# Patient Record
Sex: Female | Born: 1955 | Race: White | Hispanic: No | Smoking: Former smoker
Health system: Southern US, Community
[De-identification: ages and names within clinical notes are randomized; demographics above are authoritative.]

## PROBLEM LIST (undated history)

## (undated) DIAGNOSIS — I2510 Atherosclerotic heart disease of native coronary artery without angina pectoris: Secondary | ICD-10-CM

## (undated) DIAGNOSIS — R928 Other abnormal and inconclusive findings on diagnostic imaging of breast: Secondary | ICD-10-CM

## (undated) DIAGNOSIS — R7611 Nonspecific reaction to tuberculin skin test without active tuberculosis: Secondary | ICD-10-CM

## (undated) DIAGNOSIS — R42 Dizziness and giddiness: Secondary | ICD-10-CM

## (undated) HISTORY — DX: Atherosclerotic heart disease of native coronary artery without angina pectoris: I25.10

## (undated) HISTORY — PX: CHOLECYSTECTOMY: SHX55

## (undated) HISTORY — DX: Dizziness and giddiness: R42

## (undated) HISTORY — DX: Nonspecific reaction to tuberculin skin test without active tuberculosis: R76.11

## (undated) HISTORY — DX: Other abnormal and inconclusive findings on diagnostic imaging of breast: R92.8

## (undated) HISTORY — PX: BREAST BIOPSY: SHX20

## (undated) HISTORY — PX: TUBAL LIGATION: SHX77

---

## 2014-03-01 ENCOUNTER — Emergency Department (HOSPITAL_COMMUNITY): Payer: 59

## 2014-03-01 ENCOUNTER — Encounter (HOSPITAL_COMMUNITY): Payer: Self-pay

## 2014-03-01 ENCOUNTER — Emergency Department (HOSPITAL_COMMUNITY)
Admission: EM | Admit: 2014-03-01 | Discharge: 2014-03-01 | Disposition: A | Discharge status: Home / Self Care | Payer: 59 | Attending: Emergency Medicine | Admitting: Emergency Medicine

## 2014-03-01 DIAGNOSIS — R61 Generalized hyperhidrosis: Secondary | ICD-10-CM | POA: Insufficient documentation

## 2014-03-01 DIAGNOSIS — R000 Tachycardia, unspecified: Secondary | ICD-10-CM | POA: Insufficient documentation

## 2014-03-01 DIAGNOSIS — R0602 Shortness of breath: Secondary | ICD-10-CM | POA: Insufficient documentation

## 2014-03-01 DIAGNOSIS — R079 Chest pain, unspecified: Secondary | ICD-10-CM | POA: Diagnosis present

## 2014-03-01 DIAGNOSIS — R110 Nausea: Secondary | ICD-10-CM | POA: Diagnosis not present

## 2014-03-01 DIAGNOSIS — R0789 Other chest pain: Secondary | ICD-10-CM | POA: Diagnosis not present

## 2014-03-01 LAB — D-DIMER, QUANTITATIVE: D-Dimer, Quant: 0.29 ug/mL-FEU (ref 0.00–0.48)

## 2014-03-01 LAB — I-STAT TROPONIN, ED
TROPONIN I, POC: 0 ng/mL (ref 0.00–0.08)
Troponin i, poc: 0 ng/mL (ref 0.00–0.08)

## 2014-03-01 LAB — BASIC METABOLIC PANEL
ANION GAP: 10 (ref 5–15)
BUN: 13 mg/dL (ref 6–23)
CALCIUM: 9.4 mg/dL (ref 8.4–10.5)
CHLORIDE: 102 meq/L (ref 96–112)
CO2: 27 mmol/L (ref 19–32)
CREATININE: 0.7 mg/dL (ref 0.50–1.10)
GFR calc Af Amer: >90 mL/min (ref >90)
Glucose, Bld: 105 mg/dL — ABNORMAL HIGH (ref 70–99)
POTASSIUM: 4.3 mmol/L (ref 3.5–5.1)
SODIUM: 139 mmol/L (ref 135–145)

## 2014-03-01 LAB — CBC
HCT: 37.4 % (ref 36.0–46.0)
HEMOGLOBIN: 12.2 g/dL (ref 12.0–15.0)
MCH: 31.6 pg (ref 26.0–34.0)
MCHC: 32.6 g/dL (ref 30.0–36.0)
MCV: 96.9 fL (ref 78.0–100.0)
PLATELETS: 322 10*3/uL (ref 150–400)
RBC: 3.86 MIL/uL — ABNORMAL LOW (ref 3.87–5.11)
RDW: 13.1 % (ref 11.5–15.5)
WBC: 8.1 10*3/uL (ref 4.0–10.5)

## 2014-03-01 LAB — BRAIN NATRIURETIC PEPTIDE: B Natriuretic Peptide: 21.8 pg/mL (ref 0.0–100.0)

## 2014-03-01 NOTE — ED Provider Notes (Signed)
CSN: [id number]     Arrival date & time 03/01/14  1110 History   First MD Initiated Contact with Patient 03/01/14 1114     Chief Complaint  Patient presents with  . Chest Pain     (Consider location/radiation/quality/duration/timing/severity/associated sxs/prior Treatment) HPI Comments: Melissa Bautista is a 59 y.o. female with no significant PMHx or FHx, who presents to the ED via EMS with complaints of sudden onset substernal sharp chest pain that began at rest approximately one hour prior to arrival, and was associated with shortness of breath, nausea, and "flushing". Patient was given aspirin and sublingual nitroglycerin with relief of symptoms. Initially her chest pain was 8/10, and she describes it as sharp nonradiating intermittent pain, which has now improved to a 2/10, worse with inspiration. Patient has no family history of DVT/PE/cardiac disease, and no personal history of DVT/PE. Patient denies any fevers chills, wheezing, cough, LE swelling, recent travel, estrogen use, immobilization or surgery, palpitations, claudication, orthopnea, PND, IV drug use or cocaine use, abdominal pain, vomiting, diarrhea, constipation, dysuria, hematuria, vaginal bleeding or discharge, numbness, tingling, or weakness. She has had similar chest pain in the past, recalling one episode that occurred when she was sleeping, and resolved on its own, and she has never had any cardiac workup. She believes that day it had been due to heartburn. She did not try any heartburn medications prior to arrival today, and she had not eaten any meals today. Her only medications are black cohosh for menopausal symptoms, no other medical conditions. Nonsmoker. She is physically active daily doing aerobic and strength work outs at [employer], which she did at 7:30am with no chest pain during her work out.  Patient is a 59 y.o. female presenting with chest pain. The history is provided by the patient. No language interpreter was used.   Chest Pain Pain location:  Substernal area Pain quality: sharp   Pain radiates to:  Does not radiate Pain radiates to the back: no   Pain severity:  Moderate Onset quality:  Sudden Duration:  1 hour Timing:  Constant Progression:  Partially resolved Chronicity:  New Context: at rest   Relieved by:  Aspirin and nitroglycerin Worsened by:  Deep breathing Ineffective treatments:  None tried Associated symptoms: diaphoresis ("flushed"), nausea and shortness of breath   Associated symptoms: no abdominal pain, no anxiety, no back pain, no claudication, no cough, no dizziness, no fever, no headache, no heartburn, no lower extremity edema, no near-syncope, no numbness, no orthopnea, no palpitations, no PND, no syncope, not vomiting and no weakness   Risk factors: no diabetes mellitus, no hypertension, no prior DVT/PE, no smoking and no surgery     No past medical history on file. No past surgical history on file. No family history on file. History  Substance Use Topics  . Smoking status: Not on file  . Smokeless tobacco: Not on file  . Alcohol Use: Not on file   OB History    No data available     Review of Systems  Constitutional: Positive for diaphoresis ("flushed"). Negative for fever and chills.  Eyes: Negative for visual disturbance.  Respiratory: Positive for shortness of breath. Negative for cough and wheezing.   Cardiovascular: Positive for chest pain. Negative for palpitations, orthopnea, claudication, leg swelling, syncope, PND and near-syncope.  Gastrointestinal: Positive for nausea. Negative for heartburn, vomiting, abdominal pain, diarrhea and constipation.  Genitourinary: Negative for dysuria and hematuria.  Musculoskeletal: Negative for myalgias, back pain, arthralgias and neck pain.  Skin:  Negative for color change.  Allergic/Immunologic: Negative for immunocompromised state.  Neurological: Negative for dizziness, syncope, weakness, light-headedness, numbness and  headaches.   10 Systems reviewed and are negative for acute change except as noted in the HPI.    Allergies  Review of patient's allergies indicates no known allergies.  Home Medications   Prior to Admission medications   Not on File   BP 155/77 mmHg  Pulse 95  Temp(Src) 97.9 F (36.6 C) (Oral)  Resp 16  SpO2 99%  LMP  (Approximate) Physical Exam  Constitutional: She is oriented to person, place, and time. Vital signs are normal. She appears well-developed and well-nourished.  Non-toxic appearance. No distress.  Afebrile, nontoxic, NAD, VSS  HENT:  Head: Normocephalic and atraumatic.  Mouth/Throat: Oropharynx is clear and moist and mucous membranes are normal.  Eyes: Conjunctivae and EOM are normal. Right eye exhibits no discharge. Left eye exhibits no discharge.  Neck: Normal range of motion. Neck supple. No JVD present. Carotid bruit is not present.  No JVD or carotid bruit  Cardiovascular: Normal rate, regular rhythm, normal heart sounds and intact distal pulses.  Exam reveals no gallop and no friction rub.   No murmur heard. Pulses:      Radial pulses are 2+ on the right side, and 2+ on the left side.       Femoral pulses are 2+ on the right side, and 2+ on the left side.      Dorsalis pedis pulses are 1+ on the left side.       Posterior tibial pulses are 2+ on the right side, and 2+ on the left side.  RRR, nl s1/s2, no m/r/g, distal pulses intact although left DP pulses slightly diminished but radial/femoral/PT pulses all 2+ and equal bilaterally  Pulmonary/Chest: Effort normal and breath sounds normal. No respiratory distress. She has no decreased breath sounds. She has no wheezes. She has no rhonchi. She has no rales.  CTAB in all lung fields, no w/r/r, no chest wall TTP  Abdominal: Soft. Normal appearance and bowel sounds are normal. She exhibits no distension. There is no tenderness. There is no rigidity, no rebound and no guarding.  Musculoskeletal: Normal range of  motion.  MAE x4 No pedal edema  Neurological: She is alert and oriented to person, place, and time. She has normal strength. No sensory deficit. Gait normal.  Skin: Skin is warm, dry and intact. No rash noted.  Psychiatric: She has a normal mood and affect.  Nursing note and vitals reviewed.   ED Course  Procedures (including critical care time) Labs Review Labs Reviewed  CBC - Abnormal; Notable for the following:    RBC 3.86 (*)    All other components within normal limits  BASIC METABOLIC PANEL - Abnormal; Notable for the following:    Glucose, Bld 105 (*)    All other components within normal limits  D-DIMER, QUANTITATIVE  BRAIN NATRIURETIC PEPTIDE  I-STAT TROPOININ, ED  Rosezena SensorI-STAT TROPOININ, ED    Imaging Review Dg Chest 2 View  03/01/2014   CLINICAL DATA:  Chest pain for few hr, history of tobacco use  EXAM: CHEST  2 VIEW  COMPARISON:  None.  FINDINGS: The heart size and mediastinal contours are within normal limits. Both lungs are clear. The visualized skeletal structures are unremarkable.  IMPRESSION: No active cardiopulmonary disease.   Electronically Signed   By: Alcide CleverMark  Lukens M.D.   On: 03/01/2014 13:14     EKG Interpretation   Date/Time:  Thursday March 01 2014 11:18:09 EST Ventricular Rate:  93 PR Interval:  139 QRS Duration: 94 QT Interval:  354 QTC Calculation: 440 R Axis:   44 Text Interpretation:  Sinus rhythm Probable left atrial enlargement No old  tracing to compare Confirmed by [facility]  MD, Nicholos Johns [id number]) on  03/01/2014 11:33:13 AM      MDM   Final diagnoses:  Chest pain  Atypical chest pain    59 y.o. female with sudden onset CP that resolved with ASA/NTG given by EMS. Initial VS showed pt was slightly tachycardic, hypertensive at 180/90, but upon arrival her VS are WNL. No personal or family hx of DVT/PE/cardiac conditions. EKG with NSR, unremarkable. Will obtain labs and reassess. Pt stable at this time.   1:58 PM No ongoing symptoms, she  remains symptom free without further intervention after arrival. Trop neg, EKG with NSR. CBC and BMP unremarkable. D-dimer WNL. BNP WNL. CXR unremarkable. Will repeat Trop at 1448 (3hrs after initial trop), if neg I believe pt would be safe for discharge since pt doesn't want to be admitted, and HEART score is 2 (for age and moderately suspicious history). Additionally, she is very physically active and was active prior to her onset of symptoms during which time she had no cardiac or pulm symptoms/difficulty. Will reassess shortly.  3:15 PM Second troponin neg. Pt without ongoing symptoms. At this time, given stable vitals and no persistent symptoms, and negative cardiac work up, will have her f/up as outpt with cardiology. Patient is to be discharged with recommendation to follow up with PCP in regards to today's hospital visit. Pts symptoms unlikely to be of CAD etiology and pt has not reported any CP while in my care. Labs and imaging reviewed again prior to dc. CXR and ECG with no acute abnormalities, and neg troponins x2. Pt has been advised return to the ED if develop any exertional CP- strict return precautions discussed & patient's questions answered. Pt appears reliable for follow up and is agreeable to discharge.    BP 125/74 mmHg  Pulse 78  Temp(Src) 97.9 F (36.6 C) (Oral)  Resp 13  SpO2 98%  LMP  (Approximate)    Donnita Falls Camprubi-Soms, PA-C 03/01/14 1519  Samuel Jester, DO 03/04/14 1324

## 2014-03-01 NOTE — ED Notes (Signed)
1000: sharp sub-sternal chest pain, flushed color, non radiating, some nausea. Hx. Of vertigo. No vertigo symptoms. Took 324 mg of asa, bp 180/90, st 106, 12 lead unremarkable; 20 lt. A/c. 0.4 mg ntg. Sl with relief 2/10, bp 140/80, hr. 90's. Improved sob.

## 2014-03-01 NOTE — Discharge Instructions (Signed)
Your chest pain has been evaluated and no cardiac source was found today. There are many reasons for chest pain. You will need to follow up with cardiologist for further work up. Return to the ER for changes or worsening symptoms   Chest Pain (Nonspecific) It is often hard to give a diagnosis for the cause of chest pain. There is always a chance that your pain could be related to something serious, such as a heart attack or a blood clot in the lungs. You need to follow up with your doctor. HOME CARE  If antibiotic medicine was given, take it as directed by your doctor. Finish the medicine even if you start to feel better.  For the next few days, avoid activities that bring on chest pain. Continue physical activities as told by your doctor.  Do not use any tobacco products. This includes cigarettes, chewing tobacco, and e-cigarettes.  Avoid drinking alcohol.  Only take medicine as told by your doctor.  Follow your doctor's suggestions for more testing if your chest pain does not go away.  Keep all doctor visits you made. GET HELP IF:  Your chest pain does not go away, even after treatment.  You have a rash with blisters on your chest.  You have a fever. GET HELP RIGHT AWAY IF:   You have more pain or pain that spreads to your arm, neck, jaw, back, or belly (abdomen).  You have shortness of breath.  You cough more than usual or cough up blood.  You have very bad back or belly pain.  You feel sick to your stomach (nauseous) or throw up (vomit).  You have very bad weakness.  You pass out (faint).  You have chills. This is an emergency. Do not wait to see if the problems will go away. Call your local emergency services (911 in [place] Do not drive yourself to the hospital. MAKE SURE YOU:   Understand these instructions.  Will watch your condition.  Will get help right away if you are not doing well or get worse. Document Released: 07/15/2007 Document Revised: 01/31/2013  Document Reviewed: 07/15/2007 [facility] Patient Information 2015 [place]. This information is not intended to replace advice given to you by your health care provider. Make sure you discuss any questions you have with your health care provider.

## 2014-03-14 ENCOUNTER — Ambulatory Visit (INDEPENDENT_AMBULATORY_CARE_PROVIDER_SITE_OTHER): Payer: 59 | Admitting: Family Medicine

## 2014-03-14 ENCOUNTER — Encounter: Payer: Self-pay | Admitting: Family Medicine

## 2014-03-14 VITALS — BP 138/76 | HR 94 | Temp 97.6°F | Ht 64.75 in | Wt 170.7 lb

## 2014-03-14 DIAGNOSIS — H8110 Benign paroxysmal vertigo, unspecified ear: Secondary | ICD-10-CM

## 2014-03-14 DIAGNOSIS — R0789 Other chest pain: Secondary | ICD-10-CM

## 2014-03-14 DIAGNOSIS — Z131 Encounter for screening for diabetes mellitus: Secondary | ICD-10-CM

## 2014-03-14 DIAGNOSIS — E785 Hyperlipidemia, unspecified: Secondary | ICD-10-CM

## 2014-03-14 LAB — LIPID PANEL
CHOL/HDL RATIO: 2
Cholesterol: 201 mg/dL — ABNORMAL HIGH (ref 0–200)
HDL: 94.8 mg/dL (ref >39.00)
LDL Cholesterol: 89 mg/dL (ref 0–99)
NonHDL: 106.2
Triglycerides: 84 mg/dL (ref 0.0–149.0)
VLDL: 16.8 mg/dL (ref 0.0–40.0)

## 2014-03-14 LAB — HEMOGLOBIN A1C: Hgb A1c MFr Bld: 5.9 % (ref 4.6–6.5)

## 2014-03-14 MED ORDER — MECLIZINE HCL 25 MG PO TABS: 25.0000 mg | ORAL_TABLET | ORAL | Status: DC | PRN

## 2014-03-14 NOTE — Progress Notes (Signed)
HPI:  Melissa Bautista is here to establish care. Recently moved here from [place].  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  ROS negative for unless reported above: fevers, unintentional weight loss, hearing or vision loss, chest pain, palpitations, struggling to breath, hemoptysis, melena, hematochezia, hematuria, falls, loc, si, thoughts of self harm  Past Medical History  Diagnosis Date  . Positive TB test     ? she report slighly less the +, neg cxr  . Vertigo   . Abnormal mammogram     Past Surgical History  Procedure Laterality Date  . Breast biopsy      x3-benign per patient  . Cholecystectomy    . Tubal ligation      Family History  Problem Relation Age of Onset  . Uterine cancer Maternal Grandmother   . Breast cancer Sister   . Breast cancer Mother   . Breast cancer Cousin     History   Social History  . Marital Status: Single    Spouse Name: N/A    Number of Children: N/A  . Years of Education: N/A   Social History Main Topics  . Smoking status: Former Smoker    Quit date: 03/01/2013  . Smokeless tobacco: None  . Alcohol Use: Yes     Comment: 3 glasses of wine/week  . Drug Use: No  . Sexual Activity: None   Other Topics Concern  . None   Social History Narrative     Current outpatient prescriptions:  .  BLACK COHOSH PO, Take 1 tablet by mouth daily., Disp: , Rfl:  .   cholecalciferol (VITAMIN D) 1000 UNITS tablet, Take 1,000 Units by mouth daily., Disp: , Rfl:  .  meclizine (ANTIVERT) 25 MG tablet, Take 1 tablet (25 mg total) by mouth as needed for dizziness., Disp: 30 tablet, Rfl: 1 .  Multiple Vitamins-Minerals (MULTIVITAMIN WITH MINERALS) tablet, Take 1 tablet by mouth daily., Disp: , Rfl:  .  vitamin B-12 (CYANOCOBALAMIN) 100 MCG tablet, Take 100 mcg by mouth daily., Disp: , Rfl:   EXAM:  Filed Vitals:   03/14/14 1126  BP: 138/76  Pulse: 94  Temp: 97.6 F (36.4 C)    Body mass index is 28.61 kg/(m^2).  GENERAL: vitals reviewed and listed above, alert, oriented, appears well hydrated and in no acute distress  HEENT: atraumatic, conjunttiva clear, no obvious abnormalities on inspection of external nose and ears  NECK: no obvious masses on inspection  LUNGS: clear to auscultation bilaterally, no wheezes, rales or rhonchi, good air movement  CV: HRRR, no peripheral edema  MS: moves all extremities without noticeable abnormality  PSYCH: pleasant and cooperative, no obvious depression or anxiety  ASSESSMENT AND PLAN:  Discussed the following assessment and plan:  Hyperlipemia - Plan: Lipid Panel  BPPV (benign paroxysmal positional vertigo), unspecified laterality  Atypical chest pain - Plan: Exercise tolerance test  Screening for diabetes mellitus - Plan: Hemoglobin A1c -We reviewed the PMH, PSH, FH, SH, Meds and Allergies. -We provided refills for any medications we will prescribe as needed. -We addressed current concerns per orders and patient instructions. -We have asked for records for pertinent exams, studies, vaccines and notes from previous providers. -We have advised patient to follow up per instructions below.   -Patient advised to return or notify a doctor immediately if symptoms worsen or persist or new concerns arise.  Patient Instructions  BEFORE YOU LEAVE: -labs -schedule physical with pap in 3-4 months  Schedule  mammogram  Check on the Tdap vaccine  Sent referral for the exercise stress test  -We have ordered labs or studies at this visit. It can take up to 1-2 weeks for results and processing. We will contact you with instructions IF your results are abnormal. Normal results will be released to your [facility]. If you have not heard from us or can not find your results in [facility] in 2 weeks please contact our office.  -PLEASE SIGN UP FOR MYCHART TODAY   We recommend the following healthy lifestyle measures: - eat a healthy diet consisting of lots of vegetables, fruits, beans, nuts, seeds, healthy meats such as white chicken and fish and whole grains.  - avoid fried foods, fast food, processed foods, sodas, red meet and other fattening foods.  - get a least 150 minutes of aerobic exercise per week.        Kriste BasqueKIM, Woodard Perrell R.

## 2014-03-14 NOTE — Progress Notes (Signed)
Pre visit review using our clinic review tool, if applicable. No additional management support is needed unless otherwise documented below in the visit note. 

## 2014-03-14 NOTE — Patient Instructions (Signed)
BEFORE YOU LEAVE: -labs -schedule physical with pap in 3-4 months  Schedule mammogram  Check on the Tdap vaccine  Sent referral for the exercise stress test  -We have ordered labs or studies at this visit. It can take up to 1-2 weeks for results and processing. We will contact you with instructions IF your results are abnormal. Normal results will be released to your [facility]. If you have not heard from us or can not find your results in [facility] in 2 weeks please contact our office.  -PLEASE SIGN UP FOR MYCHART TODAY   We recommend the following healthy lifestyle measures: - eat a healthy diet consisting of lots of vegetables, fruits, beans, nuts, seeds, healthy meats such as white chicken and fish and whole grains.  - avoid fried foods, fast food, processed foods, sodas, red meet and other fattening foods.  - get a least 150 minutes of aerobic exercise per week.

## 2014-04-04 ENCOUNTER — Encounter: Payer: 59 | Admitting: Nurse Practitioner

## 2014-05-01 ENCOUNTER — Other Ambulatory Visit: Payer: Self-pay

## 2014-05-01 DIAGNOSIS — Z1231 Encounter for screening mammogram for malignant neoplasm of breast: Secondary | ICD-10-CM

## 2014-05-11 ENCOUNTER — Encounter: Payer: 59 | Admitting: Nurse Practitioner

## 2014-05-28 ENCOUNTER — Ambulatory Visit
Admission: RE | Admit: 2014-05-28 | Discharge: 2014-05-28 | Disposition: A | Discharge status: Home / Self Care | Payer: 59 | Source: Ambulatory Visit

## 2014-05-28 DIAGNOSIS — Z1231 Encounter for screening mammogram for malignant neoplasm of breast: Secondary | ICD-10-CM

## 2014-06-19 ENCOUNTER — Encounter: Payer: Self-pay | Admitting: Family Medicine

## 2014-06-19 ENCOUNTER — Ambulatory Visit (INDEPENDENT_AMBULATORY_CARE_PROVIDER_SITE_OTHER): Payer: 59 | Admitting: Family Medicine

## 2014-06-19 VITALS — BP 144/90 | HR 94 | Temp 98.0°F | Ht 64.5 in | Wt 170.8 lb

## 2014-06-19 DIAGNOSIS — R030 Elevated blood-pressure reading, without diagnosis of hypertension: Secondary | ICD-10-CM

## 2014-06-19 DIAGNOSIS — IMO0001 Reserved for inherently not codable concepts without codable children: Secondary | ICD-10-CM

## 2014-06-19 DIAGNOSIS — Z23 Encounter for immunization: Secondary | ICD-10-CM

## 2014-06-19 DIAGNOSIS — Z0000 Encounter for general adult medical examination without abnormal findings: Secondary | ICD-10-CM

## 2014-06-19 DIAGNOSIS — Z803 Family history of malignant neoplasm of breast: Secondary | ICD-10-CM

## 2014-06-19 NOTE — Addendum Note (Signed)
Addended by: Sallee LangeFUNDERBURK, JO A on: 06/19/2014 05:00 PM   Modules accepted: Orders

## 2014-06-19 NOTE — Progress Notes (Addendum)
HPI:  Here for CPE:  -Concerns and/or follow up today: none  -Diet: variety of foods, balance and well rounded  -Exercise: regular exercise - 4 days per week  -Taking folic acid, vitamin D or calcium: yes  -Diabetes and Dyslipidemia Screening: labs done, mild hyperlipidemia in the past  -Hx of HTN: no  -Vaccines: Tdap today  -pap history: done 2014 per her report and normal, reports all have been normal, she reports these have all been normal  -FDLMP: postmenopausal  -sexual activity: yes, female partner, no new partners  -wants STI testing: no  -FH breast, colon or ovarian ca: see FH Last mammogram: done Last colon cancer screening: done in 2009, normal per her report and told to repeat in 10 years  Breast Ca Risk Assessment: -sister doing genetic testing due to FH, she may consider but is too expensive and does not want to do at this time - she reports she will call me if decides to do this  -Alcohol, Tobacco, drug use: see social history  Review of Systems - no fevers, unintentional weight loss, vision loss, hearing loss, chest pain, sob, hemoptysis, melena, hematochezia, hematuria, genital discharge, changing or concerning skin lesions, bleeding, bruising, loc, thoughts of self harm or SI  Past Medical History  Diagnosis Date  . Positive TB test     ? she report slighly less the +, neg cxr  . Vertigo   . Abnormal mammogram     Past Surgical History  Procedure Laterality Date  . Breast biopsy      x3-benign per patient  . Cholecystectomy    . Tubal ligation      Family History  Problem Relation Age of Onset  . Uterine cancer Maternal Grandmother   . Breast cancer Sister   . Breast cancer Mother   . Breast cancer Cousin     History   Social History  . Marital Status: Divorced    Spouse Name: N/A  . Number of Children: N/A  . Years of Education: N/A   Social History Main Topics  . Smoking status: Former Smoker    Quit date: 03/01/2013  .  Smokeless tobacco: Not on file  . Alcohol Use: Yes     Comment: 3 glasses of wine/week  . Drug Use: No  . Sexual Activity: Not on file   Other Topics Concern  . 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  BLACK COHOSH PO, Take 1 tablet by mouth daily., Disp: , Rfl:  .  cholecalciferol (VITAMIN D) 1000 UNITS tablet, Take 1,000 Units by mouth daily., Disp: , Rfl:  .  meclizine (ANTIVERT) 25 MG tablet, Take 1 tablet (25 mg total) by mouth as needed for dizziness., Disp: 30 tablet, Rfl: 1 .  Multiple Vitamins-Minerals (MULTIVITAMIN WITH MINERALS) tablet, Take 1 tablet by mouth daily., Disp: , Rfl:  .  vitamin B-12 (CYANOCOBALAMIN) 100 MCG tablet, Take 100 mcg by mouth daily., Disp: , Rfl:   EXAM:  Filed Vitals:   06/19/14 1658  BP: 144/90  Pulse:   Temp:     GENERAL: vitals reviewed and listed below, alert, oriented, appears well hydrated and in no acute distress  HEENT: head atraumatic, PERRLA, normal  appearance of eyes, ears, nose and mouth. moist mucus membranes.  NECK: supple, no masses or lymphadenopathy  LUNGS: clear to auscultation bilaterally, no rales, rhonchi or wheeze  CV: HRRR, no peripheral edema or cyanosis, normal pedal pulses  BREAST: normal appearance - no lesions or discharge, on palpation normal breast tissue without any suspicious masses  ABDOMEN: bowel sounds normal, soft, non tender to palpation, no masses, no rebound or guarding  GU: offered pelvic, declined  RECTAL: refused  SKIN: no rash or abnormal lesions  MS: normal gait, moves all extremities normally  NEURO: CN II-XII grossly intact, normal muscle strength and sensation to light touch on extremities  PSYCH: normal affect, pleasant and cooperative  ASSESSMENT AND PLAN:  Discussed the  following assessment and plan:  Visit for preventive health examination  FH: breast cancer  Need for Tdap vaccination - Plan: Tdap vaccine greater than or equal to 7yo IM  Elevated blood pressure  -Discussed and advised all [place] preventive services health task force level A and B recommendations for age, sex and risks. -Advised at least 150 minutes of exercise per week and a healthy diet low in saturated fats and sweets and consisting of fresh fruits and vegetables, lean meats such as fish and white chicken and whole grains. -labs already done, studies and vaccines per orders this encounter -we spent a long time discussing her FH of breast cancer and I advised genetic testing and thought a yearly pelvic exam with a gynecologist may be a good idea also - she may consider this, but declined today -tdap  Orders Placed This Encounter  Procedures  . Tdap vaccine greater than or equal to 7yo IM    Patient advised to return to clinic immediately if symptoms worsen or persist or new concerns.  Patient Instructions  Before you leave: -Tdap -schedule follow up in 2-4 weeks to recheck BP  Follow up yearly, will need pap at next physical.  We recommend the following healthy lifestyle measures: - eat a healthy diet consisting of lots of vegetables, fruits, beans, nuts, seeds, healthy meats such as white chicken and fish and whole grains.  - avoid fried foods, fast food, processed foods, sodas, red meet and other fattening foods.  - get a least 150 minutes of aerobic exercise per week.      Return in about 1 year (around 06/19/2015), or if symptoms worsen or fail to improve.  Kriste BasqueKIM, Sonam Wandel R.

## 2014-06-19 NOTE — Progress Notes (Signed)
Pre visit review using our clinic review tool, if applicable. No additional management support is needed unless otherwise documented below in the visit note. 

## 2014-06-19 NOTE — Patient Instructions (Addendum)
Before you leave: -Tdap -schedule follow up in 2-4 weeks to recheck BP  Follow up yearly, will need pap at next physical.  We recommend the following healthy lifestyle measures: - eat a healthy diet consisting of lots of vegetables, fruits, beans, nuts, seeds, healthy meats such as white chicken and fish and whole grains.  - avoid fried foods, fast food, processed foods, sodas, red meet and other fattening foods.  - get a least 150 minutes of aerobic exercise per week.

## 2014-06-29 ENCOUNTER — Encounter: Payer: Self-pay | Admitting: *Deleted

## 2014-07-10 ENCOUNTER — Ambulatory Visit (INDEPENDENT_AMBULATORY_CARE_PROVIDER_SITE_OTHER): Payer: 59 | Admitting: Family Medicine

## 2014-07-10 ENCOUNTER — Encounter: Payer: Self-pay | Admitting: Family Medicine

## 2014-07-10 VITALS — BP 128/80 | HR 90 | Temp 97.7°F | Ht 64.5 in | Wt 170.4 lb

## 2014-07-10 DIAGNOSIS — I10 Essential (primary) hypertension: Secondary | ICD-10-CM | POA: Diagnosis not present

## 2014-07-10 NOTE — Progress Notes (Signed)
Pre visit review using our clinic review tool, if applicable. No additional management support is needed unless otherwise documented below in the visit note. 

## 2014-07-10 NOTE — Patient Instructions (Signed)
-  please check blood pressure from time to time at home and keep a log (once per week)  -call if running high (over 140/90)  -follow up in 3-4 months

## 2014-07-10 NOTE — Progress Notes (Signed)
  HPI:  Follow up HTN: -elevated last visit -reports: BP at home is 120-130/70/80 -denies: CP, DOE, HA, visions changes  ROS: See pertinent positives and negatives per HPI.  Past Medical History  Diagnosis Date  . Positive TB test     ? she report slighly less the +, neg cxr  . Vertigo   . Abnormal mammogram     Past Surgical History  Procedure Laterality Date  . Breast biopsy      x3-benign per patient  . Cholecystectomy    . Tubal ligation      Family History  Problem Relation Age of Onset  . Uterine cancer Maternal Grandmother   . Breast cancer Sister   . Breast cancer Mother   . Breast cancer Cousin     History   Social History  . Marital Status: Divorced    Spouse Name: N/A  . Number of Children: N/A  . Years of Education: N/A   Social History Main Topics  . Smoking status: Former Smoker    Quit date: 03/01/2013  . Smokeless tobacco: Not on file  . Alcohol Use: Yes     Comment: 3 glasses of wine/week  . Drug Use: No  . Sexual Activity: Not on file   Other Topics Concern  . 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  BLACK COHOSH PO, Take 1 tablet by mouth daily., Disp: , Rfl:  .  cholecalciferol (VITAMIN D) 1000 UNITS tablet, Take 1,000 Units by mouth daily., Disp: , Rfl:  .  meclizine (ANTIVERT) 25 MG tablet, Take 1 tablet (25 mg total) by mouth as needed for dizziness., Disp: 30 tablet, Rfl: 1 .  Multiple Vitamins-Minerals (MULTIVITAMIN WITH MINERALS) tablet, Take 1 tablet by mouth daily., Disp: , Rfl:  .  vitamin B-12 (CYANOCOBALAMIN) 100 MCG tablet, Take 100 mcg by mouth daily., Disp: , Rfl:   EXAM:  Filed Vitals:   07/10/14 0902  BP: 128/80  Pulse:   Temp:     Body mass index is 28.81 kg/(m^2).  GENERAL: vitals reviewed and listed above,  alert, oriented, appears well hydrated and in no acute distress  HEENT: atraumatic, conjunttiva clear, no obvious abnormalities on inspection of external nose and ears  NECK: no obvious masses on inspection  LUNGS: clear to auscultation bilaterally, no wheezes, rales or rhonchi, good air movement  CV: HRRR, no peripheral edema  MS: moves all extremities without noticeable abnormality  PSYCH: pleasant and cooperative, no obvious depression or anxiety  ASSESSMENT AND PLAN:  Discussed the following assessment and plan:  Essential hypertension  -BP at home great and great on recheck -monitor -Patient advised to return or notify a doctor immediately if symptoms worsen or persist or new concerns arise.  Patient Instructions  -please check blood pressure from time to time at home and keep a log (once per week)  -call if running high (over 140/90)  -follow up in 3-4 months     KIM, HANNAH R.

## 2014-11-06 ENCOUNTER — Encounter: Payer: Self-pay | Admitting: Family Medicine

## 2014-11-06 ENCOUNTER — Ambulatory Visit (INDEPENDENT_AMBULATORY_CARE_PROVIDER_SITE_OTHER): Payer: 59 | Admitting: Family Medicine

## 2014-11-06 VITALS — BP 128/86 | HR 93 | Temp 98.0°F | Ht 64.5 in | Wt 178.5 lb

## 2014-11-06 DIAGNOSIS — E669 Obesity, unspecified: Secondary | ICD-10-CM

## 2014-11-06 DIAGNOSIS — R739 Hyperglycemia, unspecified: Secondary | ICD-10-CM

## 2014-11-06 DIAGNOSIS — R030 Elevated blood-pressure reading, without diagnosis of hypertension: Secondary | ICD-10-CM

## 2014-11-06 DIAGNOSIS — IMO0001 Reserved for inherently not codable concepts without codable children: Secondary | ICD-10-CM

## 2014-11-06 NOTE — Progress Notes (Signed)
HPI:   Follow up HTN/Obesity/Prediabetes: -elevated last visit; normal on recheck and opted for observation and home monitoring -reports: BP at home is 120-130/70/80 -just started exercising 3 weeks ago -reports diet is ok - not enough fiber and daily refined sugar and white starches -denies: CP, DOE, HA, visions changes  ROS: See pertinent positives and negatives per HPI.  Past Medical History  Diagnosis Date  . Positive TB test     ? she report slighly less the +, neg cxr  . Vertigo   . Abnormal mammogram     Past Surgical History  Procedure Laterality Date  . Breast biopsy      x3-benign per patient  . Cholecystectomy    . Tubal ligation      Family History  Problem Relation Age of Onset  . Uterine cancer Maternal Grandmother   . Breast cancer Sister   . Breast cancer Mother   . Breast cancer Cousin     Social History   Social History  . Marital Status: Divorced    Spouse Name: N/A  . Number of Children: N/A  . Years of Education: N/A   Social History Main Topics  . Smoking status: Former Smoker    Quit date: 03/01/2013  . Smokeless tobacco: None  . Alcohol Use: Yes     Comment: 3 glasses of wine/week  . Drug Use: No  . Sexual Activity: Not Asked   Other Topics Concern  . 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  BLACK COHOSH PO, Take 1 tablet by mouth daily., Disp: , Rfl:  .  cholecalciferol (VITAMIN D) 1000 UNITS tablet, Take 1,000 Units by mouth daily., Disp: , Rfl:  .  CINNAMON PO, Take by mouth., Disp: , Rfl:  .  meclizine (ANTIVERT) 25 MG tablet, Take 1 tablet (25 mg total) by mouth as needed for dizziness., Disp: 30 tablet, Rfl: 1 .  Multiple Vitamins-Minerals (MULTIVITAMIN WITH MINERALS) tablet, Take 1 tablet by mouth daily., Disp: , Rfl:  .   Red Yeast Rice Extract (RED YEAST RICE PO), Take by mouth., Disp: , Rfl:  .  vitamin B-12 (CYANOCOBALAMIN) 100 MCG tablet, Take 100 mcg by mouth daily., Disp: , Rfl:   EXAM:  Filed Vitals:   11/06/14 1608  BP: 128/86  Pulse: 93  Temp: 98 F (36.7 C)    Body mass index is 30.18 kg/(m^2).  GENERAL: vitals reviewed and listed above, alert, oriented, appears well hydrated and in no acute distress  HEENT: atraumatic, conjunttiva clear, no obvious abnormalities on inspection of external nose and ears  NECK: no obvious masses on inspection  LUNGS: clear to auscultation bilaterally, no wheezes, rales or rhonchi, good air movement  CV: HRRR, no peripheral edema  MS: moves all extremities without noticeable abnormality  PSYCH: pleasant and cooperative, no obvious depression or anxiety  ASSESSMENT AND PLAN:  Discussed the following assessment and plan:  Obesity - Plan: TSH  Hyperglycemia - Plan: Hemoglobin A1c  Elevated blood pressure  -BP ok -she is frustrated about weight and wants to check thyroid level -discussed lifestyle changes at length - regular lifelong exercise, high fiber/low glycemic diet, healthy fats, regular meals -she may consider weight watchers -Patient  advised to return or notify a doctor immediately if symptoms worsen or persist or new concerns arise.  There are no Patient Instructions on file for this visit.   Colin Benton R.

## 2014-11-06 NOTE — Progress Notes (Signed)
Pre visit review using our clinic review tool, if applicable. No additional management support is needed unless otherwise documented below in the visit note. 

## 2014-11-06 NOTE — Patient Instructions (Signed)
BEFORE YOU LEAVE: -labs -follow up in 6 months  We recommend the following healthy lifestyle measures: - eat a healthy diet consisting of small portions of vegetables, fruits, beans, nuts, seeds, healthy meats such as white chicken and fish and whole grains.  - avoid sweets, white starches, fried foods, fast food, processed foods, sodas, red meet and other fattening foods.  - get a least 150 minutes of aerobic exercise per week.

## 2014-11-07 LAB — TSH: TSH: 1.9 u[IU]/mL (ref 0.35–4.50)

## 2014-11-07 LAB — HEMOGLOBIN A1C: Hgb A1c MFr Bld: 5.8 % (ref 4.6–6.5)

## 2014-12-14 ENCOUNTER — Other Ambulatory Visit: Payer: Self-pay | Admitting: Family Medicine

## 2014-12-14 DIAGNOSIS — Z789 Other specified health status: Secondary | ICD-10-CM

## 2014-12-18 ENCOUNTER — Other Ambulatory Visit (INDEPENDENT_AMBULATORY_CARE_PROVIDER_SITE_OTHER): Payer: 59

## 2014-12-18 DIAGNOSIS — Z789 Other specified health status: Secondary | ICD-10-CM

## 2014-12-18 LAB — GLUCOSE, POCT (MANUAL RESULT ENTRY): POC Glucose: 107 mg/dl — AB (ref 70–99)

## 2014-12-19 ENCOUNTER — Other Ambulatory Visit: Payer: 59

## 2015-01-08 ENCOUNTER — Encounter: Payer: Self-pay | Admitting: Family Medicine

## 2015-01-08 ENCOUNTER — Ambulatory Visit (INDEPENDENT_AMBULATORY_CARE_PROVIDER_SITE_OTHER): Payer: 59 | Admitting: Family Medicine

## 2015-01-08 VITALS — BP 132/78 | HR 92 | Temp 97.9°F | Ht 64.5 in | Wt 176.4 lb

## 2015-01-08 DIAGNOSIS — K5900 Constipation, unspecified: Secondary | ICD-10-CM | POA: Diagnosis not present

## 2015-01-08 NOTE — Progress Notes (Signed)
Pre visit review using our clinic review tool, if applicable. No additional management support is needed unless otherwise documented below in the visit note. 

## 2015-01-08 NOTE — Patient Instructions (Signed)
-  metameucil or citracel every morning  -mirilax once daily per instructions if you become stopped up  -please let us know in 1 month how you are doing, sooner if worsening or other concerns

## 2015-01-08 NOTE — Progress Notes (Signed)
HPI:  Acute visit for:  Constipation: -reports always gets stopped up when travels -traveled a few weeks ago and got stopped up -has had 3 episodes of brief, mild (a few minutes) LLQ abd pain in the last few weeks -after thanksgiving dinner cleaned out bowels and feels better now -denies: nausea, vomiting, hematochezia, melena, malaise, weight loss, hematuria, dysuria -UTD on colonoscopy -does pelvic with gyn next week  ROS: See pertinent positives and negatives per HPI.  Past Medical History  Diagnosis Date  . Positive TB test     ? she report slighly less the +, neg cxr  . Vertigo   . Abnormal mammogram     Past Surgical History  Procedure Laterality Date  . Breast biopsy      x3-benign per patient  . Cholecystectomy    . Tubal ligation      Family History  Problem Relation Age of Onset  . Uterine cancer Maternal Grandmother   . Breast cancer Sister   . Breast cancer Mother   . Breast cancer Cousin     Social History   Social History  . Marital Status: Divorced    Spouse Name: N/A  . Number of Children: N/A  . Years of Education: N/A   Social History Main Topics  . Smoking status: Former Smoker    Quit date: 03/01/2013  . Smokeless tobacco: None  . Alcohol Use: Yes     Comment: 3 glasses of wine/week  . Drug Use: No  . Sexual Activity: Not Asked   Other Topics Concern  . 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  BLACK COHOSH PO, Take 1 tablet by mouth daily., Disp: , Rfl:  .  cholecalciferol (VITAMIN D) 1000 UNITS tablet, Take 1,000 Units by mouth daily., Disp: , Rfl:  .  CINNAMON PO, Take by mouth., Disp: , Rfl:  .  meclizine (ANTIVERT) 25 MG tablet, Take 1 tablet (25 mg total) by mouth as needed for dizziness., Disp: 30 tablet, Rfl: 1 .  Multiple  Vitamins-Minerals (MULTIVITAMIN WITH MINERALS) tablet, Take 1 tablet by mouth daily., Disp: , Rfl:  .  Red Yeast Rice Extract (RED YEAST RICE PO), Take by mouth., Disp: , Rfl:  .  vitamin B-12 (CYANOCOBALAMIN) 100 MCG tablet, Take 100 mcg by mouth daily., Disp: , Rfl:   EXAM:  Filed Vitals:   01/08/15 1607  BP: 132/78  Pulse: 92  Temp: 97.9 F (36.6 C)    Body mass index is 29.82 kg/(m^2).  GENERAL: vitals reviewed and listed above, alert, oriented, appears well hydrated and in no acute distress  HEENT: atraumatic, conjunttiva clear, no obvious abnormalities on inspection of external nose and ears  NECK: no obvious masses on inspection  ABD: BS+, soft, NTTP  MS: moves all extremities without noticeable abnormality  PSYCH: pleasant and cooperative, no obvious depression or anxiety  ASSESSMENT AND PLAN:  Discussed the following assessment and plan:  Constipation, unspecified constipation type  -we discussed possible serious and likely etiologies of constipation and LLQ pain, workup and treatment, treatment risks and return precautions -after this discussion, Tresa EndoKelly opted for fiber supplement, mirilax if needed and may use during travel -follow up advised if any symptoms persist  and to let us know how she is feeling in1 month -of course, we advised Dane  to return or notify a doctor immediately if symptoms worsen or persist or new concerns arise.  .  -Patient advised to return or notify a doctor immediately if symptoms worsen or persist or new concerns arise.  Patient Instructions  -metameucil or citracel every morning  -mirilax once daily per instructions if you become stopped up  -please let us know in 1 month how you are doing, sooner if worsening or other concerns     KIM, HANNAH R.

## 2015-04-22 ENCOUNTER — Encounter: Payer: Self-pay | Admitting: Family Medicine

## 2015-04-22 ENCOUNTER — Ambulatory Visit (INDEPENDENT_AMBULATORY_CARE_PROVIDER_SITE_OTHER): Payer: BLUE CROSS/BLUE SHIELD | Admitting: Family Medicine

## 2015-04-22 VITALS — BP 132/74 | HR 84 | Temp 97.7°F | Ht 64.5 in | Wt 179.8 lb

## 2015-04-22 DIAGNOSIS — R55 Syncope and collapse: Secondary | ICD-10-CM | POA: Diagnosis not present

## 2015-04-22 LAB — BASIC METABOLIC PANEL
BUN: 17 mg/dL (ref 7–25)
CHLORIDE: 98 mmol/L (ref 98–110)
CO2: 28 mmol/L (ref 20–31)
CREATININE: 0.65 mg/dL (ref 0.50–1.05)
Calcium: 10.2 mg/dL (ref 8.6–10.4)
Glucose, Bld: 86 mg/dL (ref 65–99)
Potassium: 4.1 mmol/L (ref 3.5–5.3)
Sodium: 138 mmol/L (ref 135–146)

## 2015-04-22 NOTE — Progress Notes (Signed)
Pre visit review using our clinic review tool, if applicable. No additional management support is needed unless otherwise documented below in the visit note. 

## 2015-04-22 NOTE — Patient Instructions (Signed)
BEFORE YOU LEAVE: -EKG -labs -schedule follow up in 1-2 months  We placed a referral for you as discussed for the MRI. It usually takes about 1-2 weeks to process and schedule this referral. If you have not heard from us regarding this appointment in 2 weeks please contact our office.  Caution with urination and standing suddenly. Stay hydrated.  Seek care immediatly if recurrently symptoms or new symptoms.

## 2015-04-22 NOTE — Progress Notes (Signed)
HPI:  Melissa Bautista is a pleasant 60 yo with a PMH vertigo, prediabetes and elevated blood pressure here for an acute visit for syncope. She reports 2 episodes of brief syncope during micturition in the middle of the night 3 nights ago. She was sitting on the toilet and had just finish voiding when she suddenly felt lightheaded with mild nausea and fell forward. This was witnessed by her husband and was several seconds to 25 seconds in length. When she went to stand up she had a second episode of refill LOC. She felt nauseous and clammy after the events. She denies any preceding chest pain, shortness of breath, palpitations, or tongue biting, seizure like activity, headache or recent illness. She has a history of chronic intermittent positional vertigo and reports an extensive workup with neuro imaging 5 years ago. She exercises on a regular basis and does not have any cardiac or presyncopal symptoms with exercise. She has had no symptoms since. She has a history of syncope once remotely as well.  ROS: See pertinent positives and negatives per HPI.  Past Medical History  Diagnosis Date  . Positive TB test     ? she report slighly less the +, neg cxr  . Vertigo   . Abnormal mammogram     Past Surgical History  Procedure Laterality Date  . Breast biopsy      x3-benign per patient  . Cholecystectomy    . Tubal ligation      Family History  Problem Relation Age of Onset  . Uterine cancer Maternal Grandmother   . Breast cancer Sister   . Breast cancer Mother   . Breast cancer Cousin     Social History   Social History  . Marital Status: Divorced    Spouse Name: N/A  . Number of Children: N/A  . Years of Education: N/A   Social History Main Topics  . Smoking status: Former Smoker    Quit date: 03/01/2013  . Smokeless tobacco: None  . Alcohol Use: Yes     Comment: 3 glasses of wine/week  . Drug Use: No  . Sexual Activity: Not Asked   Other Topics Concern  . 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  BLACK COHOSH PO, Take 1 tablet by mouth daily., Disp: , Rfl:  .  cholecalciferol (VITAMIN D) 1000 UNITS tablet, Take 1,000 Units by mouth daily., Disp: , Rfl:  .  CINNAMON PO, Take by mouth., Disp: , Rfl:  .  meclizine (ANTIVERT) 25 MG tablet, Take 1 tablet (25 mg total) by mouth as needed for dizziness., Disp: 30 tablet, Rfl: 1 .  Multiple Vitamins-Minerals (MULTIVITAMIN WITH MINERALS) tablet, Take 1 tablet by mouth daily., Disp: , Rfl:  .  Red Yeast Rice Extract (RED YEAST RICE PO), Take by mouth., Disp: , Rfl:  .  vitamin B-12 (CYANOCOBALAMIN) 100 MCG tablet, Take 100 mcg by mouth daily., Disp: , Rfl:   EXAM:  Filed Vitals:   04/22/15 1434  BP: 132/74  Pulse: 84  Temp: 97.7 F (36.5 C)    Body mass index is 30.4 kg/(m^2).  GENERAL: vitals reviewed and listed above, alert, oriented, appears well hydrated and in no acute  distress  HEENT: atraumatic, conjunttiva clear, PERRLA, EOMI,  no obvious abnormalities on inspection of external nose and ears  NECK: no obvious masses on inspection, no bruit  LUNGS: clear to auscultation bilaterally, no wheezes, rales or rhonchi, good air movement  CV: HRRR, no peripheral edema  MS: moves all extremities without noticeable abnormality  PSYCH: pleasant and cooperative, no obvious depression or anxiety, CN II-XII grossly intact, and thought processing grossly intact, finger to nose normal.  ASSESSMENT AND PLAN:  Discussed the following assessment and plan:  Syncope, unspecified syncope type - Plan: CBC with Differential, Basic metabolic panel, TSH, Hemoglobin A1c, MR Brain W Wo Contrast  -we discussed possible serious and likely etiologies, workup and treatment, treatment risks and return  precautions -EKG with NSR -after this discussion, Tresa EndoKelly opted for labs and MRI (she is worried about intracranial issues) possible neruology consult, she declined today; doubt seizure or cardiac origin and micturition related vasovagal syncope most likely -follow up advised 1-2 months -of course, we advised Tresa EndoKelly  to return or notify a doctor immediately if symptoms worsen or persist or new concerns arise.   -Patient advised to return or notify a doctor immediately if symptoms worsen or persist or new concerns arise.  Patient Instructions  BEFORE YOU LEAVE: -EKG -labs -schedule follow up in 1-2 months  We placed a referral for you as discussed for the MRI. It usually takes about 1-2 weeks to process and schedule this referral. If you have not heard from us regarding this appointment in 2 weeks please contact our office.  Caution with urination and standing suddenly. Stay hydrated.  Seek care immediatly if recurrently symptoms or new symptoms.      Kriste BasqueKIM, HANNAH R.

## 2015-04-23 LAB — TSH: TSH: 2.18 mIU/L

## 2015-04-24 LAB — CBC WITH DIFFERENTIAL/PLATELET

## 2015-04-24 LAB — HEMOGLOBIN A1C

## 2015-05-07 ENCOUNTER — Ambulatory Visit: Payer: 59 | Admitting: Family Medicine

## 2015-05-13 ENCOUNTER — Other Ambulatory Visit: Payer: BLUE CROSS/BLUE SHIELD

## 2015-06-11 ENCOUNTER — Telehealth: Payer: Self-pay | Admitting: *Deleted

## 2015-06-11 NOTE — Telephone Encounter (Signed)
-----   Message from Terressa KoyanagiHannah R Kim, DO sent at 05/05/2015  2:29 PM EDT ----- Can you call pt See how she is doing. Ask if she had tests elsewhere. Thanks. ----- Message -----    From: Johnella MoloneyJo A Funderburk, CMA    Sent: 04/25/2015   4:59 PM      To: Terressa KoyanagiHannah R Kim, DO  Dr Kim-Per Danella DeisShay the pt works for [employer] and told her she would have labs done there. Ronnald CollumJo Anne   ----- Message -----    From: Terressa KoyanagiHannah R Kim, DO    Sent: 04/25/2015   7:06 AM      To: Johnella MoloneyJo A Funderburk, CMA  Can you check on why her labs were canceled? CBC and hgba1c are pending and say canceled. Other labs ok. Thanks.

## 2015-06-11 NOTE — Telephone Encounter (Signed)
Left message on machine for patient to return our call 

## 2015-06-13 NOTE — Telephone Encounter (Signed)
I left a message for the pt to return my call. 

## 2015-06-14 NOTE — Telephone Encounter (Signed)
I left a message for the pt to return my call. 

## 2015-06-18 ENCOUNTER — Encounter: Payer: Self-pay | Admitting: *Deleted

## 2015-06-18 NOTE — Telephone Encounter (Signed)
As of today the pt has not returned my call.  Letter mailed to the pt to call the office regarding lab tests.

## 2015-07-12 ENCOUNTER — Telehealth: Payer: Self-pay | Admitting: *Deleted

## 2015-07-12 ENCOUNTER — Other Ambulatory Visit: Payer: Self-pay | Admitting: Family Medicine

## 2015-07-12 DIAGNOSIS — Z1231 Encounter for screening mammogram for malignant neoplasm of breast: Secondary | ICD-10-CM

## 2015-07-12 NOTE — Telephone Encounter (Signed)
Patient called in reference to the tests that were ordered in March and informed me she did have her lab work done but was told the lavender tube was hemolyzed so the test could not be ran and she wanted to let Dr Selena BattenKim know this.  States she will check her schedule and call back for a lab visit.

## 2015-07-16 ENCOUNTER — Ambulatory Visit: Payer: BLUE CROSS/BLUE SHIELD | Admitting: Family Medicine

## 2015-08-02 ENCOUNTER — Ambulatory Visit
Admission: RE | Admit: 2015-08-02 | Discharge: 2015-08-02 | Disposition: A | Discharge status: Home / Self Care | Payer: BLUE CROSS/BLUE SHIELD | Source: Ambulatory Visit | Attending: Family Medicine | Admitting: Family Medicine

## 2015-08-02 DIAGNOSIS — Z1231 Encounter for screening mammogram for malignant neoplasm of breast: Secondary | ICD-10-CM

## 2016-02-20 ENCOUNTER — Telehealth: Payer: Self-pay | Admitting: Family Medicine

## 2016-02-20 NOTE — Telephone Encounter (Signed)
Pt need to have FMLA paperwork filled out within 5 business days.  Pt is scheduled Friday at 3:15

## 2016-02-20 NOTE — Telephone Encounter (Signed)
Continue please make sure this is a 30 minute appointment so that we can complete her paperwork there is possible? Thank you.

## 2016-02-20 NOTE — Telephone Encounter (Signed)
Melissa Bautista scheduled the pt for a 30 min appt.

## 2016-02-21 ENCOUNTER — Ambulatory Visit (INDEPENDENT_AMBULATORY_CARE_PROVIDER_SITE_OTHER): Payer: BLUE CROSS/BLUE SHIELD | Admitting: Family Medicine

## 2016-02-21 ENCOUNTER — Encounter: Payer: Self-pay | Admitting: Family Medicine

## 2016-02-21 VITALS — BP 144/98 | HR 95 | Temp 97.8°F | Ht 64.5 in | Wt 184.7 lb

## 2016-02-21 DIAGNOSIS — R51 Headache: Secondary | ICD-10-CM

## 2016-02-21 DIAGNOSIS — R42 Dizziness and giddiness: Secondary | ICD-10-CM | POA: Diagnosis not present

## 2016-02-21 DIAGNOSIS — H8110 Benign paroxysmal vertigo, unspecified ear: Secondary | ICD-10-CM

## 2016-02-21 DIAGNOSIS — I10 Essential (primary) hypertension: Secondary | ICD-10-CM | POA: Diagnosis not present

## 2016-02-21 DIAGNOSIS — R519 Headache, unspecified: Secondary | ICD-10-CM

## 2016-02-21 HISTORY — DX: Essential (primary) hypertension: I10

## 2016-02-21 HISTORY — DX: Headache, unspecified: R51.9

## 2016-02-21 HISTORY — DX: Benign paroxysmal vertigo, unspecified ear: H81.10

## 2016-02-21 MED ORDER — MECLIZINE HCL 25 MG PO TABS: 25.0000 mg | ORAL_TABLET | ORAL | 1 refills | Status: DC | PRN

## 2016-02-21 MED ORDER — LOSARTAN POTASSIUM 50 MG PO TABS: 50.0000 mg | ORAL_TABLET | Freq: Every day | ORAL | 3 refills | Status: DC

## 2016-02-21 NOTE — Progress Notes (Signed)
Pre visit review using our clinic review tool, if applicable. No additional management support is needed unless otherwise documented below in the visit note. 

## 2016-02-21 NOTE — Progress Notes (Signed)
HPI:  Melissa Bautista is a pleasant 61 yo F with a PMH significant for prediabetes, vertigo, syncope and poor compliance here for an acute visit for vertigo. She did not follow up as we instruction, nor did she complete the labs or MRI that we advised at her last visit. Today she reports that all of her symptoms resolved and she was feeling great so did not follow up or do MRI/labs. Reports had complete labs at physical at work recently and all ok other then very mildly elevated blood glu. She is here today for worsening "vertigo" reports feels off, usually in the morning about 3 days per week for the last several months. Has sensation of dizziness lasting briefly and sometimes accompanied by nausea and or a mild headache. Sometimes she feels certain eye movements or changing shadows when driving trigger this. Reports had normal eye exam in last year. Wears classes. Denies speech changes, fevers, malaise, weakness, numbness, cognitive changes, vomiting. She has had more stress then usual as took in her grandchildren for a few months.Reports wants FMLA in case every feels she can not drive as new job has point system and you can loose your job with very little leeway for unscheduled leave unless has FMLA. Reports had flu shot at work and pap with gyn in November..  ROS: See pertinent positives and negatives per HPI.  Past Medical History:  Diagnosis Date  . Abnormal mammogram   . Positive TB test    ? she report slighly less the +, neg cxr  . Vertigo     Past Surgical History:  Procedure Laterality Date  . BREAST BIOPSY     x3-benign per patient  . CHOLECYSTECTOMY    . TUBAL LIGATION      Family History  Problem Relation Age of Onset  . Uterine cancer Maternal Grandmother   . Breast cancer Sister   . Breast cancer Mother   . Breast cancer Cousin     Social History   Social History  . Marital status: Divorced    Spouse name: N/A  . Number of children: N/A  . Years of  education: N/A   Social History Main Topics  . Smoking status: Former Smoker    Quit date: 03/01/2013  . Smokeless tobacco: None  . Alcohol use Yes     Comment: 3 glasses of wine/week  . Drug use: No  . Sexual activity: Not Asked   Other Topics Concern  . 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  BLACK COHOSH PO, Take 1 tablet by mouth daily., Disp: , Rfl:  .  cholecalciferol (VITAMIN D) 1000 UNITS tablet, Take 1,000 Units by mouth daily., Disp: , Rfl:  .  CINNAMON PO, Take by mouth., Disp: , Rfl:  .  meclizine (ANTIVERT) 25 MG tablet, Take 1 tablet (25 mg total) by mouth as needed for dizziness., Disp: 30 tablet, Rfl: 1 .  Multiple Vitamins-Minerals (MULTIVITAMIN WITH MINERALS) tablet, Take 1 tablet by mouth daily., Disp: , Rfl:  .  NON FORMULARY, Divertigo (natural supplement), Disp: , Rfl:  .  Red Yeast Rice Extract (RED YEAST RICE PO), Take by mouth., Disp: , Rfl:  .  vitamin B-12 (CYANOCOBALAMIN)  100 MCG tablet, Take 100 mcg by mouth daily., Disp: , Rfl:  .  losartan (COZAAR) 50 MG tablet, Take 1 tablet (50 mg total) by mouth daily., Disp: 30 tablet, Rfl: 3  EXAM:  Vitals:   02/21/16 1516  BP: (!) 144/98  Pulse: 95  Temp: 97.8 F (36.6 C)    Body mass index is 31.21 kg/m.  GENERAL: vitals reviewed and listed above, alert, oriented, appears well hydrated and in no acute distress  HEENT: atraumatic, conjunttiva clear, wears glasses, PERRLA, visual acuity grossly int, minimal clear effusion R TM, no obvious abnormalities on inspection of external nose and ears  NECK: no obvious masses on inspection, no carotid bruit  LUNGS: clear to auscultation bilaterally, no wheezes, rales or rhonchi, good air movement  CV: HRRR, no peripheral edema  MS: moves all extremities  without noticeable abnormality  PSYCH/NEURO: pleasant and cooperative, no obvious depression or anxiety, CN II-XII grossly intact, finger to nose normal, speech and thought processing grossly intact, gait normal, dix hallpike without nystagmus but causes symptoms bilat  ASSESSMENT AND PLAN:  Discussed the following assessment and plan:  Benign paroxysmal positional vertigo, unspecified laterality -hx of, however current symptoms and exam are not classic for this dx; perhaps elevated BP and anxiety are contributing  -we discussed possible serious and likely etiologies, workup and treatment, treatment risks and return precautions -after this discussion, Melissa Bautista opted for neurology evaluation, refill on meclizine, start BP med, I'll fill out temporary FMLA until seen by neurologist as did advise not to drive if having vertigo -follow up advised in 1 month -of course, we advised Melissa Bautista  to return or notify a doctor immediately if symptoms worsen or persist or new concerns arise.  Essential hypertension -discussed options for treatment and risks and decided to start losartan in case has impending dx of diabetes, seems to be in prediabtes per her report and she is working on exercise and a healthy diet  Frequent headaches -referral to neurology per above  -Patient advised to return or notify a doctor immediately if symptoms worsen or persist or new concerns arise.  Patient Instructions  BEFORE YOU LEAVE: -obtain and update pap -follow up: in 1 month, please bring a copy of your recent labs  Please start the blood pressure medication and take daily.  We will complete and fax the paperwork.  Do not drive if having vertigo.  We placed a referral for you as discussed to the neurologist. It usually takes about 1-2 weeks to process and schedule this referral. If you have not heard from [place] regarding this appointment in 2 weeks please contact our office.     Kriste Basque R., DO

## 2016-02-21 NOTE — Patient Instructions (Signed)
BEFORE YOU LEAVE: -obtain and update pap -follow up: in 1 month, please bring a copy of your recent labs  Please start the blood pressure medication and take daily.  We will complete and fax the paperwork.  Do not drive if having vertigo.  We placed a referral for you as discussed to the neurologist. It usually takes about 1-2 weeks to process and schedule this referral. If you have not heard from us regarding this appointment in 2 weeks please contact our office.

## 2016-03-26 ENCOUNTER — Encounter: Payer: Self-pay | Admitting: Family Medicine

## 2016-03-26 ENCOUNTER — Ambulatory Visit (INDEPENDENT_AMBULATORY_CARE_PROVIDER_SITE_OTHER): Payer: BLUE CROSS/BLUE SHIELD | Admitting: Family Medicine

## 2016-03-26 VITALS — BP 126/72 | HR 87 | Temp 98.3°F | Ht 64.5 in | Wt 183.0 lb

## 2016-03-26 DIAGNOSIS — I10 Essential (primary) hypertension: Secondary | ICD-10-CM | POA: Diagnosis not present

## 2016-03-26 DIAGNOSIS — R739 Hyperglycemia, unspecified: Secondary | ICD-10-CM

## 2016-03-26 DIAGNOSIS — R42 Dizziness and giddiness: Secondary | ICD-10-CM | POA: Diagnosis not present

## 2016-03-26 DIAGNOSIS — R519 Headache, unspecified: Secondary | ICD-10-CM

## 2016-03-26 DIAGNOSIS — E785 Hyperlipidemia, unspecified: Secondary | ICD-10-CM | POA: Diagnosis not present

## 2016-03-26 DIAGNOSIS — R51 Headache: Secondary | ICD-10-CM

## 2016-03-26 HISTORY — DX: Hyperlipidemia, unspecified: E78.5

## 2016-03-26 NOTE — Progress Notes (Signed)
HPI:  Follow up Hypertension. Hx poor compliance and presented with numerous complaints last visit. Referred to neurology evaluation of vertigo and frequent headaches. Requested she bring labs she had elsewhere. Advised starting BP medication last visit. Reports she is feeling "so much better!". Headaches have pretty much resolved. Still has occasional vertigo but it is improved - she has a neurology evaluation in April.  Brought lab results from health screening done 11/2015. Cholesterol was a bit elevated with LDL 159, HDL 87, Ratio 3.1. Fasting glu 106, but hgba1c 5.4. CMP, CBC and TSH also done. Denies CP, SOB, DOE, swelling. Feels eats fairly healthy and is trying to walk daily. Wants to recheck labs next visit instead of today.   ROS: See pertinent positives and negatives per HPI.  Past Medical History:  Diagnosis Date  . Abnormal mammogram   . Positive TB test    ? she report slighly less the +, neg cxr  . Vertigo     Past Surgical History:  Procedure Laterality Date  . BREAST BIOPSY     x3-benign per patient  . CHOLECYSTECTOMY    . TUBAL LIGATION      Family History  Problem Relation Age of Onset  . Uterine cancer Maternal Grandmother   . Breast cancer Sister   . Breast cancer Mother   . Breast cancer Cousin     Social History   Social History  . Marital status: Divorced    Spouse name: N/A  . Number of children: N/A  . Years of education: N/A   Social History Main Topics  . Smoking status: Former Smoker    Quit date: 03/01/2013  . Smokeless tobacco: Never Used  . Alcohol use Yes     Comment: 3 glasses of wine/week  . Drug use: No  . Sexual activity: Not Asked   Other Topics Concern  . 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  BLACK COHOSH PO, Take 1  tablet by mouth daily., Disp: , Rfl:  .  cholecalciferol (VITAMIN D) 1000 UNITS tablet, Take 1,000 Units by mouth daily., Disp: , Rfl:  .  CINNAMON PO, Take by mouth., Disp: , Rfl:  .  losartan (COZAAR) 50 MG tablet, Take 1 tablet (50 mg total) by mouth daily., Disp: 30 tablet, Rfl: 3 .  meclizine (ANTIVERT) 25 MG tablet, Take 1 tablet (25 mg total) by mouth as needed for dizziness., Disp: 30 tablet, Rfl: 1 .  Multiple Vitamins-Minerals (MULTIVITAMIN WITH MINERALS) tablet, Take 1 tablet by mouth daily., Disp: , Rfl:  .  NON FORMULARY, Divertigo (natural supplement), Disp: , Rfl:  .  Red Yeast Rice Extract (RED YEAST RICE PO), Take by mouth., Disp: , Rfl:  .  vitamin B-12 (CYANOCOBALAMIN) 100 MCG tablet, Take 100 mcg by mouth daily., Disp: , Rfl:   EXAM:  Vitals:   03/26/16 1616  BP: 126/72  Pulse: 87  Temp: 98.3 F (36.8 C)    Body mass index is 30.93 kg/m.  GENERAL: vitals reviewed and listed above, alert, oriented, appears well hydrated and in no acute distress  HEENT: atraumatic, conjunttiva clear, no obvious abnormalities on inspection of external nose and ears  NECK: no obvious masses on inspection  LUNGS: clear to auscultation bilaterally, no wheezes, rales or rhonchi, good air movement  CV: HRRR, no peripheral edema  MS: moves all extremities without noticeable abnormality  PSYCH: pleasant and cooperative, no obvious depression or anxiety  ASSESSMENT AND PLAN:  Discussed the following assessment and plan:  Essential hypertension -BP mildly borderline on arrival but looks good on recheck -continue losartan -labs reviewed and plan repeat at physical in 3 months  Frequent headaches -resolved  Vertigo -improved, still plans to see neurologist -FMLA if needed in interim to avoid driving if has spell  Hyperlipidemia, unspecified hyperlipidemia type Hyperglycemia Obesity -lifestyle recs -Mediterranean diet -CPE in 3 months with labs  -Patient advised to  return or notify a doctor immediately if symptoms worsen or persist or new concerns arise.  Patient Instructions  BEFORE YOU LEAVE: -follow up: in 3 months; morning appointment and come fasting if possible  Continue the losartan daily  We recommend the following healthy lifestyle for LIFE: 1) Small portions.   Tip: eat off of a salad plate instead of a dinner plate.  Tip: if you need more or a snack choose fruits, veggies and/or a handful of nuts or seeds.  2) Eat a healthy clean diet.  * Tip: Avoid (less then 1 serving per week): processed foods, sweets, sweetened drinks, white starches (rice, flour, bread, potatoes, pasta, etc), red meat, fast foods, butter  *Tip: CHOOSE instead   * 5-9 servings per day of fresh or frozen fruits and vegetables (but not corn, potatoes, bananas, canned or dried fruit)   *nuts and seeds, beans   *olives and olive oil   *small portions of lean meats such as fish and white chicken    *small portions of whole grains  3)Get at least 150 minutes of sweaty aerobic exercise per week.  4)Reduce stress - consider counseling, meditation and relaxation to balance other aspects of your life.            Kriste Basque R., DO

## 2016-03-26 NOTE — Progress Notes (Signed)
Pre visit review using our clinic review tool, if applicable. No additional management support is needed unless otherwise documented below in the visit note. 

## 2016-03-26 NOTE — Patient Instructions (Signed)
BEFORE YOU LEAVE: -follow up: in 3 months; morning appointment and come fasting if possible  Continue the losartan daily  We recommend the following healthy lifestyle for LIFE: 1) Small portions.   Tip: eat off of a salad plate instead of a dinner plate.  Tip: if you need more or a snack choose fruits, veggies and/or a handful of nuts or seeds.  2) Eat a healthy clean diet.  * Tip: Avoid (less then 1 serving per week): processed foods, sweets, sweetened drinks, white starches (rice, flour, bread, potatoes, pasta, etc), red meat, fast foods, butter  *Tip: CHOOSE instead   * 5-9 servings per day of fresh or frozen fruits and vegetables (but not corn, potatoes, bananas, canned or dried fruit)   *nuts and seeds, beans   *olives and olive oil   *small portions of lean meats such as fish and white chicken    *small portions of whole grains  3)Get at least 150 minutes of sweaty aerobic exercise per week.  4)Reduce stress - consider counseling, meditation and relaxation to balance other aspects of your life.

## 2016-05-13 ENCOUNTER — Ambulatory Visit: Payer: BLUE CROSS/BLUE SHIELD | Admitting: Neurology

## 2016-05-26 ENCOUNTER — Other Ambulatory Visit: Payer: Self-pay | Admitting: Family Medicine

## 2016-06-25 NOTE — Progress Notes (Signed)
HPI:  Here for CPE:  -Concerns and/or follow up today: none Lab results from health screening done 11/2015. Cholesterol was a bit elevated with LDL 159, HDL 87, Ratio 3.1. Fasting glu 106, but hgba1c 5.4. CMP, CBC and TSH also done.  PMH HTN. Due for labs (BMP, CBC, lipids). She continues to have headaches/vertigo a few times per month. Was to see neurologist, but she went to the wrong building. Now neurology appt 07/22/16 and she request we complete work restrictions paperwork again to extend to specialist appt. Unchanged. No new symptoms.Feels well otherwise. She did not do the MRI we ordered and she preferred to wait to see the neurologist.  -Diet: variety of foods, balance and well rounded - trying to eat healthier  -Exercise:  regular exercise walking 5 days per week 2 miles  -Taking folic acid, vitamin D or calcium: no  -Diabetes and Dyslipidemia Screening: fasting for labs  -Vaccines: UTD  -pap history: per prior report done in 2014 and normal and all prior normal - wants to repeat today  -FDLMP: n/a  -sexual activity: yes, female partner, no new partners  -wants STI testing (Hep C if born [birth year]): no  -FH breast, colon or ovarian ca: see FH Last mammogram: 07/2015 birads 1 - she agrees to reschedule Sister did genetic testing due to FH Ca, pt declined again to pursue eval with genetics Last colon cancer screening: done in 2009 and normal per her prior report  -Alcohol, Tobacco, drug use: see social history  Review of Systems - no fevers, unintentional weight loss, vision loss, hearing loss, chest pain, sob, hemoptysis, melena, hematochezia, hematuria, genital discharge, changing or concerning skin lesions, bleeding, bruising, loc, thoughts of self harm or SI  Past Medical History:  Diagnosis Date  . Abnormal mammogram   . Positive TB test    ? she report slighly less the +, neg cxr  . Vertigo     Past Surgical History:  Procedure Laterality Date  . BREAST BIOPSY      x3-benign per patient  . CHOLECYSTECTOMY    . TUBAL LIGATION      Family History  Problem Relation Age of Onset  . Uterine cancer Maternal Grandmother   . Breast cancer Sister   . Breast cancer Mother   . Breast cancer Cousin     Social History   Social History  . Marital status: Divorced    Spouse name: N/A  . Number of children: N/A  . Years of education: N/A   Social History Main Topics  . Smoking status: Former Smoker    Quit date: 03/01/2013  . Smokeless tobacco: Never Used  . Alcohol use Yes     Comment: 3 glasses of wine/week  . Drug use: No  . Sexual activity: Not Asked   Other Topics Concern  . 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  BLACK COHOSH PO, Take 1 tablet by mouth daily., Disp: , Rfl:  .  cholecalciferol (VITAMIN D) 1000 UNITS tablet, Take 1,000 Units by mouth daily., Disp: , Rfl:  .  CINNAMON PO, Take by mouth., Disp: , Rfl:  .  losartan (COZAAR) 50 MG tablet, TAKE 1 TABLET BY MOUTH DAILY, Disp:  30 tablet, Rfl: 5 .  meclizine (ANTIVERT) 25 MG tablet, Take 1 tablet (25 mg total) by mouth as needed for dizziness., Disp: 30 tablet, Rfl: 1 .  Multiple Vitamins-Minerals (MULTIVITAMIN WITH MINERALS) tablet, Take 1 tablet by mouth daily., Disp: , Rfl:  .  NON FORMULARY, Divertigo (natural supplement), Disp: , Rfl:  .  Red Yeast Rice Extract (RED YEAST RICE PO), Take by mouth., Disp: , Rfl:  .  vitamin B-12 (CYANOCOBALAMIN) 100 MCG tablet, Take 100 mcg by mouth daily., Disp: , Rfl:   EXAM:  Vitals:   06/26/16 0804  BP: 120/74  Pulse: 84  Temp: 98.2 F (36.8 C)    GENERAL: vitals reviewed and listed below, alert, oriented, appears well hydrated and in no acute distress  HEENT: head atraumatic, PERRLA, normal appearance of eyes, ears, nose and  mouth. moist mucus membranes.  NECK: supple, no masses or lymphadenopathy  LUNGS: clear to auscultation bilaterally, no rales, rhonchi or wheeze  CV: HRRR, no peripheral edema or cyanosis, normal pedal pulses  ABDOMEN: bowel sounds normal, soft, non tender to palpation, no masses, no rebound or guarding  BREAST: normal appearance - no skin lesions or discharge noted on inspection of both breasts, on palpation of both breast and axillary region no suspicious lesions appreciated today  GU: normal appearance of external genitalia - no lesions or masses appreciated, normal appearing vaginal mucosa - no abnormal discharge, normal appearance of cervix - no lesions or abnormal discharge observed - pap obtained  RECTAL: deferred  SKIN: no rash or abnormal lesions  MS: normal gait, moves all extremities normally  NEURO: normal gait, speech and thought processing grossly intact, muscle tone grossly intact throughout, gait normal  PSYCH: normal affect, pleasant and cooperative  ASSESSMENT AND PLAN:  Discussed the following assessment and plan:  Encounter for preventive health examination  Essential hypertension - Plan: Basic metabolic panel, CBC  Hyperlipidemia, unspecified hyperlipidemia type - Plan: Lipid panel  Vertigo  hep c screening - Plan: Hepatitis C antibody  Cervical cancer screening - Plan: PAP [[place]]  -pap obtained  -again advised needs further eval for the vertigo, MRI, etc - she prefers to wait on seeing neuro, advise will do FMLA to not drive/out of work when has episodes until specialist appt, then ant further work restriction or FMLA related to vertigo or headaches will need to come from specialist.   -Discussed and advised all [place] preventive services health task force level A and B recommendations for age, sex and risks.  -Advised at least 150 minutes of exercise per week and a healthy diet with avoidance of (less then 1 serving per week) processed foods,  white starches, red meat, fast foods and sweets and consisting of: * 5-9 servings of fresh fruits and vegetables (not corn or potatoes) *nuts and seeds, beans *olives and olive oil *lean meats such as fish and white chicken  *whole grains  -labs, studies and vaccines per orders this encounter  Orders Placed This Encounter  Procedures  . Basic metabolic panel  . CBC  . Lipid panel  . Hepatitis C antibody    Patient advised to return to clinic immediately if symptoms worsen or persist or new concerns.  Patient Instructions  BEFORE YOU LEAVE: -paperwork to [facility] -lab -follow up: 3-4 months  Please schedule your mammogram  We have ordered labs and a pap smear at this visit. It can take up to 1-2 weeks for results and processing. IF results require follow up or explanation,  we will call you with instructions. Clinically stable results will be released to your [facility]. If you have not heard from us or cannot find your results in [facility] in 2 weeks please contact our office at [phone].  If you are not yet signed up for [facility], please consider signing up.   We recommend the following healthy lifestyle for LIFE: 1) Small portions.   Tip: eat off of a salad plate instead of a dinner plate.  Tip: if you need more or a snack choose fruits, veggies and/or a handful of nuts or seeds.  2) Eat a healthy clean diet.  * Tip: Avoid (less then 1 serving per week): processed foods, sweets, sweetened drinks, white starches (rice, flour, bread, potatoes, pasta, etc), red meat, fast foods, butter  *Tip: CHOOSE instead   * 5-9 servings per day of fresh or frozen fruits and vegetables (but not corn, potatoes, bananas, canned or dried fruit)   *nuts and seeds, beans   *olives and olive oil   *small portions of lean meats such as fish and white chicken    *small portions of whole grains  3)Get at least 150 minutes of sweaty aerobic exercise per week.  4)Reduce stress - consider counseling,  meditation and relaxation to balance other aspects of your life.    WE NOW OFFER    [facility] FAST TRACK!!!  SAME DAY Appointments for ACUTE CARE  Such as: Sprains, Injuries, cuts, abrasions, rashes, muscle pain, joint pain, back pain Colds, flu, sore throats, headache, allergies, cough, fever  Ear pain, sinus and eye infections Abdominal pain, nausea, vomiting, diarrhea, upset stomach Animal/insect bites  3 Easy Ways to Schedule: Walk-In Scheduling Call in scheduling Mychart Sign-up: [email or web address]                No Follow-up on file.  Kriste BasqueKIM, Jaslynne Dahan R., DO

## 2016-06-26 ENCOUNTER — Ambulatory Visit (INDEPENDENT_AMBULATORY_CARE_PROVIDER_SITE_OTHER): Payer: BLUE CROSS/BLUE SHIELD | Admitting: Family Medicine

## 2016-06-26 ENCOUNTER — Other Ambulatory Visit (HOSPITAL_COMMUNITY)
Admission: RE | Admit: 2016-06-26 | Discharge: 2016-06-26 | Disposition: A | Discharge status: Home / Self Care | Payer: BLUE CROSS/BLUE SHIELD | Source: Ambulatory Visit | Attending: Family Medicine | Admitting: Family Medicine

## 2016-06-26 ENCOUNTER — Encounter: Payer: Self-pay | Admitting: Family Medicine

## 2016-06-26 VITALS — BP 120/74 | HR 84 | Temp 98.2°F | Ht 64.75 in | Wt 180.6 lb

## 2016-06-26 DIAGNOSIS — Z0000 Encounter for general adult medical examination without abnormal findings: Secondary | ICD-10-CM | POA: Diagnosis not present

## 2016-06-26 DIAGNOSIS — Z7289 Other problems related to lifestyle: Secondary | ICD-10-CM | POA: Diagnosis not present

## 2016-06-26 DIAGNOSIS — E785 Hyperlipidemia, unspecified: Secondary | ICD-10-CM | POA: Diagnosis not present

## 2016-06-26 DIAGNOSIS — R42 Dizziness and giddiness: Secondary | ICD-10-CM | POA: Diagnosis not present

## 2016-06-26 DIAGNOSIS — I10 Essential (primary) hypertension: Secondary | ICD-10-CM | POA: Diagnosis not present

## 2016-06-26 DIAGNOSIS — Z124 Encounter for screening for malignant neoplasm of cervix: Secondary | ICD-10-CM | POA: Diagnosis present

## 2016-06-26 LAB — CBC WITH DIFFERENTIAL/PLATELET
BASOS ABS: 0 {cells}/uL (ref 0–200)
Basophils Relative: 0 %
EOS ABS: 144 {cells}/uL (ref 15–500)
EOS PCT: 2 %
HEMATOCRIT: 39.5 % (ref 35.0–45.0)
HEMOGLOBIN: 13.3 g/dL (ref 11.7–15.5)
LYMPHS ABS: 2160 {cells}/uL (ref 850–3900)
Lymphocytes Relative: 30 %
MCH: 32.4 pg (ref 27.0–33.0)
MCHC: 33.7 g/dL (ref 32.0–36.0)
MCV: 96.3 fL (ref 80.0–100.0)
MONO ABS: 792 {cells}/uL (ref 200–950)
MPV: 9.7 fL (ref 7.5–12.5)
Monocytes Relative: 11 %
NEUTROS ABS: 4104 {cells}/uL (ref 1500–7800)
NEUTROS PCT: 57 %
Platelets: 360 10*3/uL (ref 140–400)
RBC: 4.1 MIL/uL (ref 3.80–5.10)
RDW: 13.6 % (ref 11.0–15.0)
WBC: 7.2 10*3/uL (ref 3.8–10.8)

## 2016-06-26 LAB — BASIC METABOLIC PANEL
BUN: 18 mg/dL (ref 7–25)
CHLORIDE: 102 mmol/L (ref 98–110)
CO2: 24 mmol/L (ref 20–31)
Calcium: 10.3 mg/dL (ref 8.6–10.4)
Creat: 0.71 mg/dL (ref 0.50–1.05)
Glucose, Bld: 92 mg/dL (ref 65–99)
Potassium: 4.5 mmol/L (ref 3.5–5.3)
Sodium: 139 mmol/L (ref 135–146)

## 2016-06-26 LAB — LIPID PANEL
CHOL/HDL RATIO: 2.7 ratio (ref <5.0)
CHOLESTEROL: 225 mg/dL — AB (ref <200)
HDL: 83 mg/dL (ref >50)
LDL CALC: 129 mg/dL — AB (ref <100)
TRIGLYCERIDES: 64 mg/dL (ref <150)
VLDL: 13 mg/dL (ref <30)

## 2016-06-26 NOTE — Patient Instructions (Addendum)
BEFORE YOU LEAVE: -[facility] -follow up: 3-4 months  Please schedule your mammogram  We have ordered labs and a pap smear at this visit. It can take up to 1-2 weeks for results and processing. IF results require follow up or explanation, we will call you with instructions. Clinically stable results will be released to your [facility]. If you have not heard from us or cannot find your results in [facility] in 2 weeks please contact our office at [phone].  If you are not yet signed up for [facility], please consider signing up.   We recommend the following healthy lifestyle for LIFE: 1) Small portions.   Tip: eat off of a salad plate instead of a dinner plate.  Tip: if you need more or a snack choose fruits, veggies and/or a handful of nuts or seeds.  2) Eat a healthy clean diet.  * Tip: Avoid (less then 1 serving per week): processed foods, sweets, sweetened drinks, white starches (rice, flour, bread, potatoes, pasta, etc), red meat, fast foods, butter  *Tip: CHOOSE instead   * 5-9 servings per day of fresh or frozen fruits and vegetables (but not corn, potatoes, bananas, canned or dried fruit)   *nuts and seeds, beans   *olives and olive oil   *small portions of lean meats such as fish and white chicken    *small portions of whole grains  3)Get at least 150 minutes of sweaty aerobic exercise per week.  4)Reduce stress - consider counseling, meditation and relaxation to balance other aspects of your life.    WE NOW OFFER   [facility] FAST TRACK!!!  SAME DAY Appointments for ACUTE CARE  Such as: Sprains, Injuries, cuts, abrasions, rashes, muscle pain, joint pain, back pain Colds, flu, sore throats, headache, allergies, cough, fever  Ear pain, sinus and eye infections Abdominal pain, nausea, vomiting, diarrhea, upset stomach Animal/insect bites  3 Easy Ways to Schedule: Walk-In Scheduling Call in scheduling Mychart Sign-up:  [email or web address]

## 2016-06-26 NOTE — Addendum Note (Signed)
Addended by: Bonnye FavaKWEI, NANA K on: 06/26/2016 09:04 AM   Modules accepted: Orders

## 2016-06-27 LAB — HEPATITIS C ANTIBODY: HCV Ab: NEGATIVE

## 2016-06-29 ENCOUNTER — Encounter: Payer: Self-pay | Admitting: Family Medicine

## 2016-06-29 LAB — CYTOLOGY - PAP
Diagnosis: NEGATIVE
HPV (WINDOPATH): NOT DETECTED

## 2016-07-21 ENCOUNTER — Telehealth: Payer: Self-pay | Admitting: Family Medicine

## 2016-07-22 ENCOUNTER — Ambulatory Visit: Payer: BLUE CROSS/BLUE SHIELD | Admitting: Neurology

## 2016-08-13 NOTE — Telephone Encounter (Signed)
error 

## 2016-08-27 ENCOUNTER — Other Ambulatory Visit: Payer: Self-pay | Admitting: Family Medicine

## 2016-08-27 DIAGNOSIS — Z1231 Encounter for screening mammogram for malignant neoplasm of breast: Secondary | ICD-10-CM

## 2016-09-07 ENCOUNTER — Ambulatory Visit: Payer: BLUE CROSS/BLUE SHIELD

## 2016-09-08 ENCOUNTER — Ambulatory Visit
Admission: RE | Admit: 2016-09-08 | Discharge: 2016-09-08 | Disposition: A | Discharge status: Home / Self Care | Payer: BLUE CROSS/BLUE SHIELD | Source: Ambulatory Visit | Attending: Family Medicine | Admitting: Family Medicine

## 2016-09-08 DIAGNOSIS — Z1231 Encounter for screening mammogram for malignant neoplasm of breast: Secondary | ICD-10-CM

## 2016-10-29 ENCOUNTER — Encounter: Payer: Self-pay | Admitting: Family Medicine

## 2017-02-18 ENCOUNTER — Encounter: Payer: Self-pay | Admitting: Family Medicine

## 2017-09-13 ENCOUNTER — Other Ambulatory Visit: Payer: Self-pay | Admitting: Nurse Practitioner

## 2017-09-13 DIAGNOSIS — Z1231 Encounter for screening mammogram for malignant neoplasm of breast: Secondary | ICD-10-CM

## 2017-10-20 ENCOUNTER — Ambulatory Visit
Admission: RE | Admit: 2017-10-20 | Discharge: 2017-10-20 | Disposition: A | Discharge status: Home / Self Care | Payer: BLUE CROSS/BLUE SHIELD | Source: Ambulatory Visit | Attending: Nurse Practitioner | Admitting: Nurse Practitioner

## 2017-10-20 DIAGNOSIS — Z1231 Encounter for screening mammogram for malignant neoplasm of breast: Secondary | ICD-10-CM

## 2018-04-12 ENCOUNTER — Other Ambulatory Visit: Payer: Self-pay | Admitting: Nurse Practitioner

## 2018-04-12 DIAGNOSIS — R319 Hematuria, unspecified: Secondary | ICD-10-CM

## 2018-04-12 DIAGNOSIS — R109 Unspecified abdominal pain: Secondary | ICD-10-CM

## 2018-04-15 ENCOUNTER — Ambulatory Visit
Admission: RE | Admit: 2018-04-15 | Discharge: 2018-04-15 | Disposition: A | Discharge status: Home / Self Care | Payer: BLUE CROSS/BLUE SHIELD | Source: Ambulatory Visit | Attending: Nurse Practitioner | Admitting: Nurse Practitioner

## 2018-04-15 DIAGNOSIS — R319 Hematuria, unspecified: Secondary | ICD-10-CM

## 2018-04-15 DIAGNOSIS — R109 Unspecified abdominal pain: Secondary | ICD-10-CM

## 2018-09-27 ENCOUNTER — Other Ambulatory Visit: Payer: Self-pay | Admitting: Nurse Practitioner

## 2018-09-27 DIAGNOSIS — Z1231 Encounter for screening mammogram for malignant neoplasm of breast: Secondary | ICD-10-CM

## 2018-11-10 ENCOUNTER — Other Ambulatory Visit: Payer: Self-pay

## 2018-11-10 ENCOUNTER — Ambulatory Visit
Admission: RE | Admit: 2018-11-10 | Discharge: 2018-11-10 | Disposition: A | Discharge status: Home / Self Care | Payer: BLUE CROSS/BLUE SHIELD | Source: Ambulatory Visit | Attending: Nurse Practitioner | Admitting: Nurse Practitioner

## 2018-11-10 DIAGNOSIS — Z1231 Encounter for screening mammogram for malignant neoplasm of breast: Secondary | ICD-10-CM

## 2019-09-28 ENCOUNTER — Other Ambulatory Visit: Payer: Self-pay | Admitting: Nurse Practitioner

## 2019-09-28 ENCOUNTER — Other Ambulatory Visit: Payer: Self-pay

## 2019-09-28 ENCOUNTER — Ambulatory Visit
Admission: RE | Admit: 2019-09-28 | Discharge: 2019-09-28 | Disposition: A | Discharge status: Home / Self Care | Payer: BC Managed Care – PPO | Source: Ambulatory Visit | Attending: Nurse Practitioner | Admitting: Nurse Practitioner

## 2019-09-28 DIAGNOSIS — M79672 Pain in left foot: Secondary | ICD-10-CM

## 2019-11-06 ENCOUNTER — Other Ambulatory Visit: Payer: Self-pay | Admitting: Nurse Practitioner

## 2019-11-06 DIAGNOSIS — Z1231 Encounter for screening mammogram for malignant neoplasm of breast: Secondary | ICD-10-CM

## 2019-11-22 ENCOUNTER — Other Ambulatory Visit: Payer: Self-pay

## 2019-11-22 ENCOUNTER — Ambulatory Visit
Admission: RE | Admit: 2019-11-22 | Discharge: 2019-11-22 | Disposition: A | Discharge status: Home / Self Care | Payer: BC Managed Care – PPO | Source: Ambulatory Visit | Attending: Nurse Practitioner | Admitting: Nurse Practitioner

## 2019-11-22 DIAGNOSIS — Z1231 Encounter for screening mammogram for malignant neoplasm of breast: Secondary | ICD-10-CM

## 2020-11-13 ENCOUNTER — Other Ambulatory Visit: Payer: Self-pay | Admitting: Nurse Practitioner

## 2020-11-13 DIAGNOSIS — Z1231 Encounter for screening mammogram for malignant neoplasm of breast: Secondary | ICD-10-CM

## 2020-12-12 ENCOUNTER — Other Ambulatory Visit: Payer: Self-pay

## 2020-12-12 ENCOUNTER — Ambulatory Visit
Admission: RE | Admit: 2020-12-12 | Discharge: 2020-12-12 | Disposition: A | Discharge status: Home / Self Care | Payer: BC Managed Care – PPO | Source: Ambulatory Visit | Attending: Nurse Practitioner | Admitting: Nurse Practitioner

## 2020-12-12 DIAGNOSIS — Z1231 Encounter for screening mammogram for malignant neoplasm of breast: Secondary | ICD-10-CM

## 2021-11-26 ENCOUNTER — Other Ambulatory Visit: Payer: Self-pay | Admitting: Nurse Practitioner

## 2021-11-26 DIAGNOSIS — Z1231 Encounter for screening mammogram for malignant neoplasm of breast: Secondary | ICD-10-CM

## 2022-01-14 ENCOUNTER — Ambulatory Visit
Admission: RE | Admit: 2022-01-14 | Discharge: 2022-01-14 | Disposition: A | Discharge status: Home / Self Care | Payer: BC Managed Care – PPO | Source: Ambulatory Visit | Attending: Nurse Practitioner | Admitting: Nurse Practitioner

## 2022-01-14 DIAGNOSIS — Z1231 Encounter for screening mammogram for malignant neoplasm of breast: Secondary | ICD-10-CM

## 2022-09-04 IMAGING — MG DIGITAL SCREENING BILAT W/ TOMO W/ CAD
8 series · 9 of 24 positions shown · non-contrast
Comparison: Previous exam(s).

CLINICAL DATA: Screening.

EXAM:
DIGITAL SCREENING BILATERAL MAMMOGRAM WITH TOMO AND CAD

[L CC synth-2D]
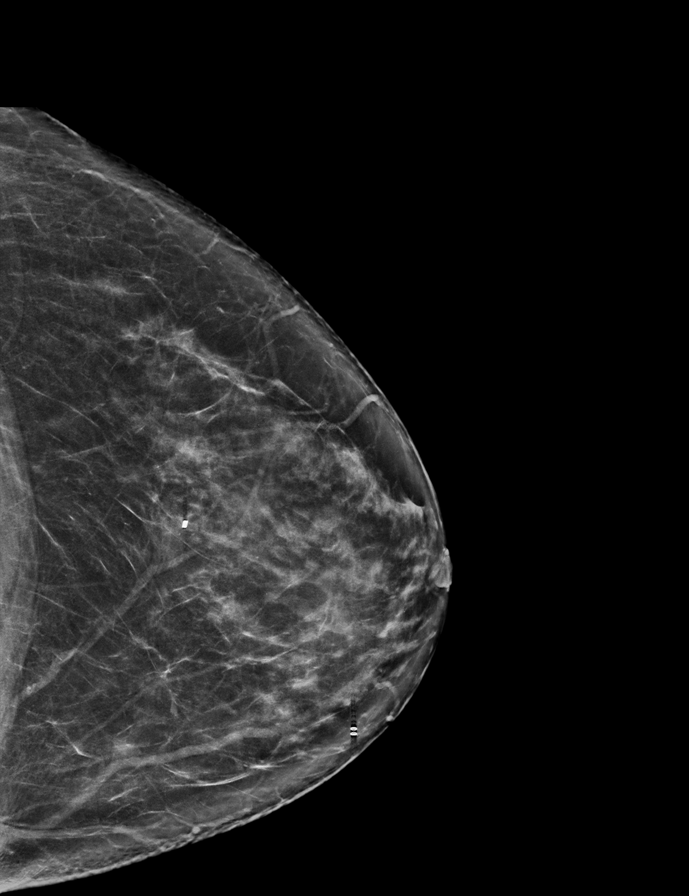

[L MLO synth-2D]
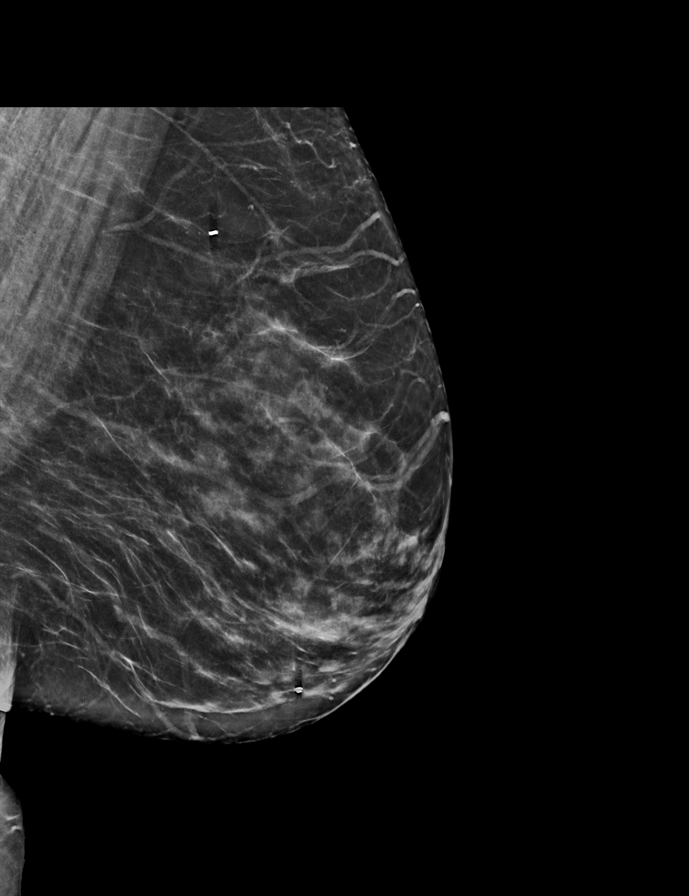

[R CC synth-2D]
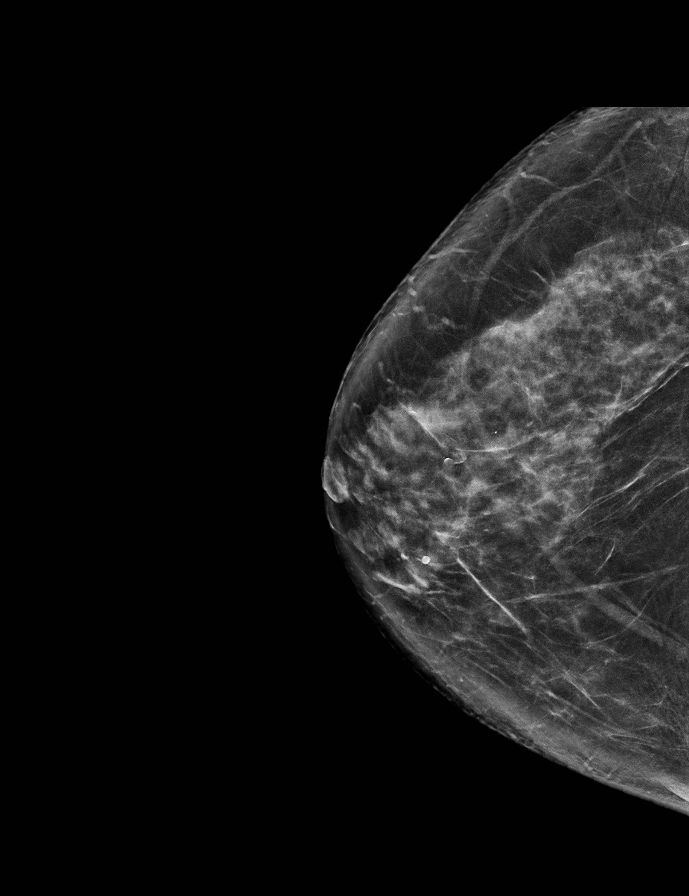

[R MLO synth-2D]
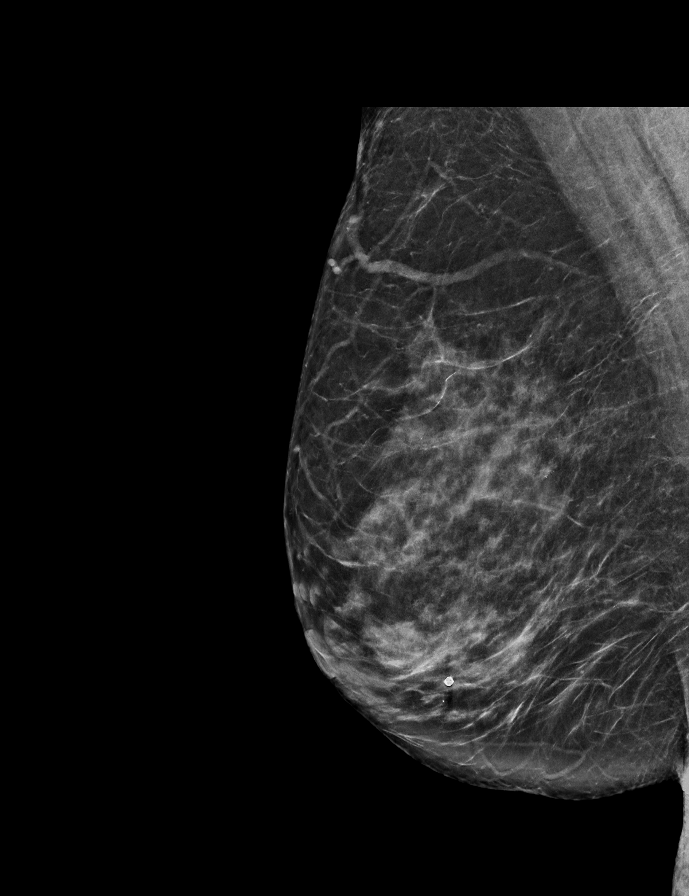

[L CC tomo · 2 of 70 frames shown]
[frame 23/70]
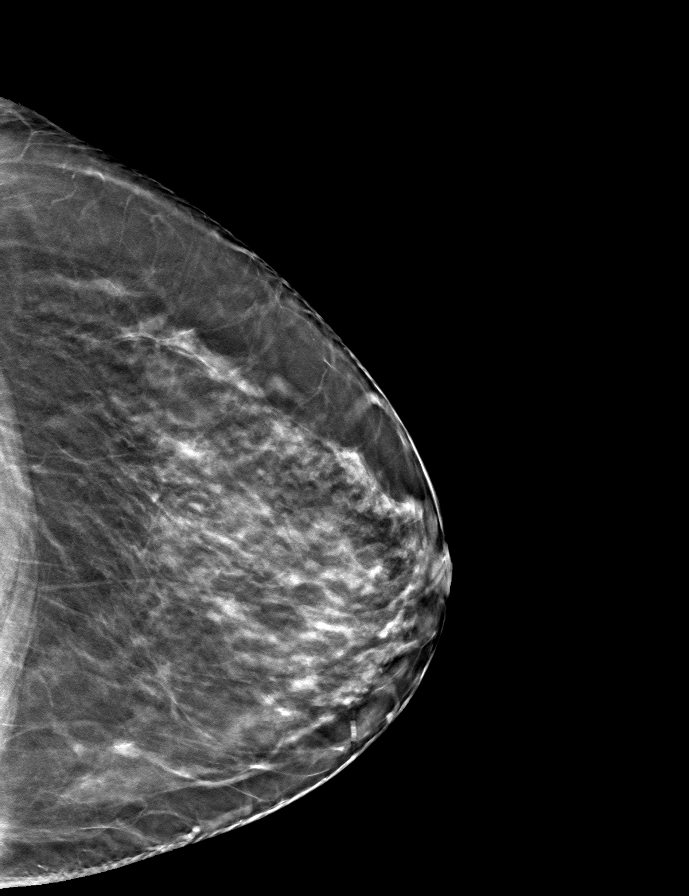
[frame 35/70]
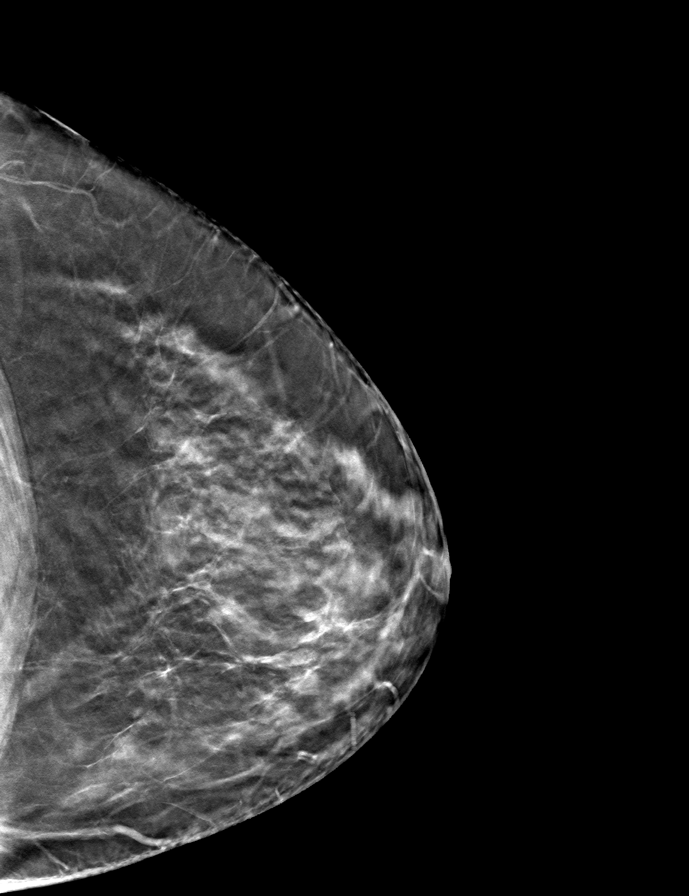

[R MLO tomo · tomo slice 37/72.0]
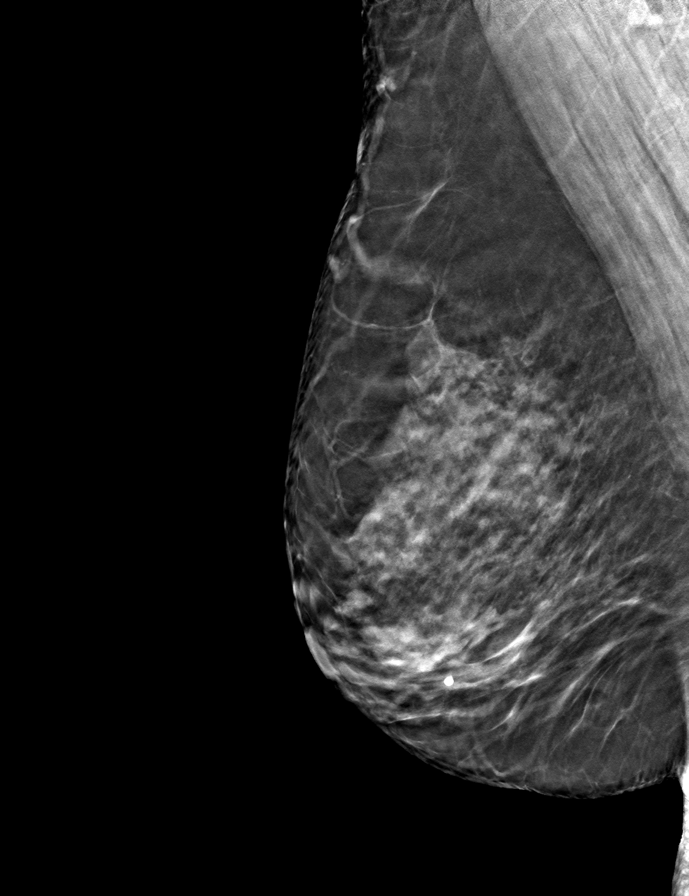

[L MLO tomo · tomo slice 34/67.0]
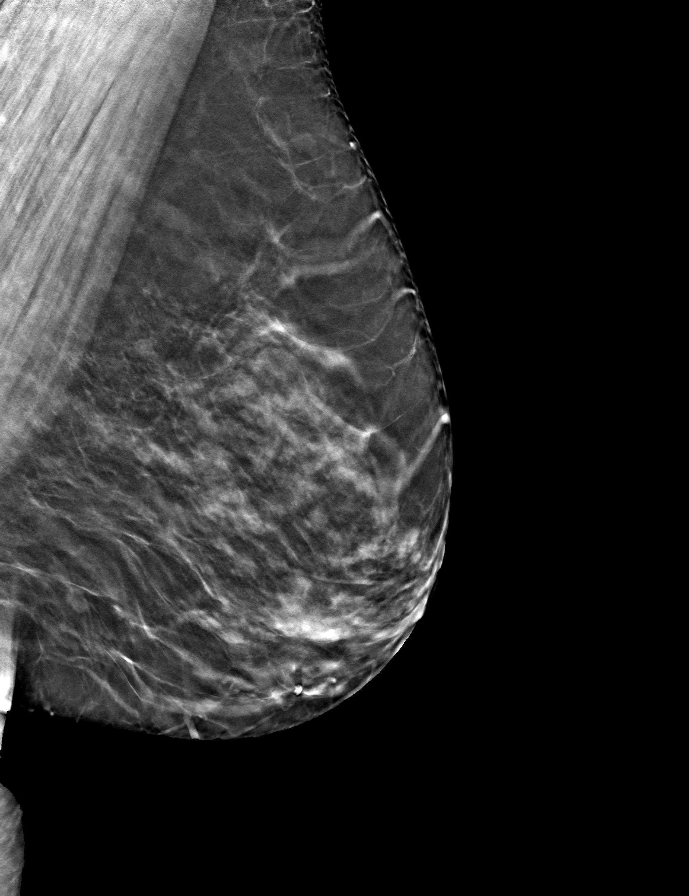

[R CC tomo · tomo slice 39/76.0]
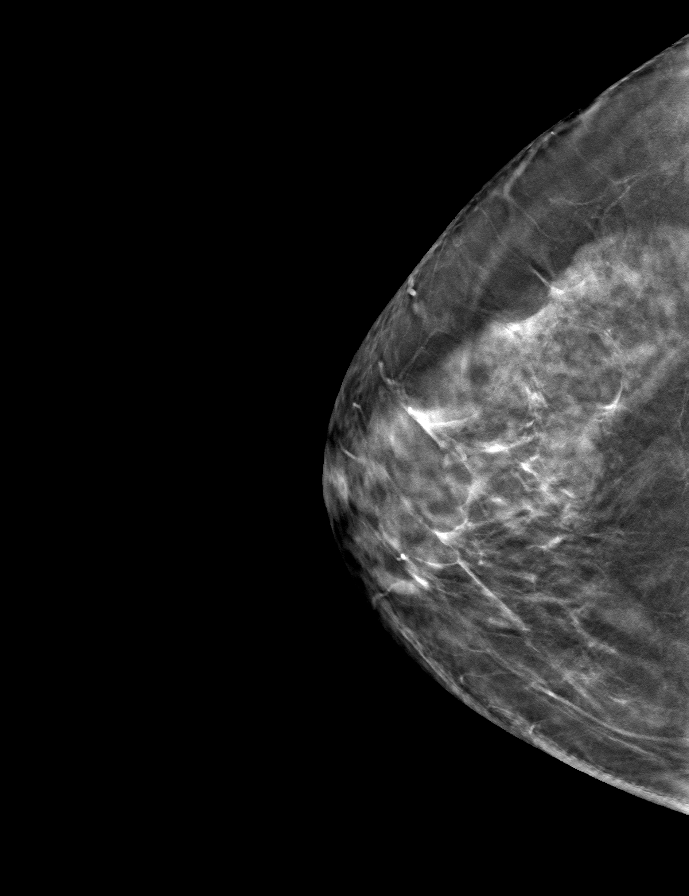

[9 of 24 positions shown; findings below may reference images not displayed]

ACR Breast Density Category c: The breast tissue is heterogeneously
dense, which may obscure small masses.
FINDINGS: There are no findings suspicious for malignancy. Images were
processed with CAD.
IMPRESSION: No mammographic evidence of malignancy. A result letter of this
screening mammogram will be mailed directly to the patient.

RECOMMENDATION:
Screening mammogram in one year. (Code:FT-[id number])

BI-RADS CATEGORY  1: Negative.

## 2022-12-16 ENCOUNTER — Other Ambulatory Visit: Payer: Self-pay | Admitting: Nurse Practitioner

## 2022-12-16 DIAGNOSIS — Z1231 Encounter for screening mammogram for malignant neoplasm of breast: Secondary | ICD-10-CM

## 2023-01-20 ENCOUNTER — Ambulatory Visit
Admission: RE | Admit: 2023-01-20 | Discharge: 2023-01-20 | Disposition: A | Discharge status: Home / Self Care | Payer: Medicare Other | Source: Ambulatory Visit | Attending: Nurse Practitioner | Admitting: Nurse Practitioner

## 2023-01-20 DIAGNOSIS — Z1231 Encounter for screening mammogram for malignant neoplasm of breast: Secondary | ICD-10-CM

## 2023-01-22 ENCOUNTER — Other Ambulatory Visit: Payer: Self-pay | Admitting: Nurse Practitioner

## 2023-01-22 DIAGNOSIS — R928 Other abnormal and inconclusive findings on diagnostic imaging of breast: Secondary | ICD-10-CM

## 2023-02-05 ENCOUNTER — Other Ambulatory Visit: Payer: Self-pay | Admitting: Nurse Practitioner

## 2023-02-05 ENCOUNTER — Ambulatory Visit
Admission: RE | Admit: 2023-02-05 | Discharge: 2023-02-05 | Disposition: A | Discharge status: Home / Self Care | Payer: Medicare Other | Source: Ambulatory Visit | Attending: Nurse Practitioner | Admitting: Nurse Practitioner

## 2023-02-05 DIAGNOSIS — R928 Other abnormal and inconclusive findings on diagnostic imaging of breast: Secondary | ICD-10-CM

## 2023-02-11 ENCOUNTER — Ambulatory Visit
Admission: RE | Admit: 2023-02-11 | Discharge: 2023-02-11 | Disposition: A | Discharge status: Home / Self Care | Payer: Medicare Other | Source: Ambulatory Visit | Attending: Nurse Practitioner | Admitting: Nurse Practitioner

## 2023-02-11 DIAGNOSIS — R921 Mammographic calcification found on diagnostic imaging of breast: Secondary | ICD-10-CM

## 2023-02-11 DIAGNOSIS — R928 Other abnormal and inconclusive findings on diagnostic imaging of breast: Secondary | ICD-10-CM

## 2023-02-11 HISTORY — PX: BREAST BIOPSY: SHX20

## 2023-02-12 LAB — SURGICAL PATHOLOGY

## 2023-02-17 ENCOUNTER — Other Ambulatory Visit: Payer: Self-pay | Admitting: Family Medicine

## 2023-02-17 DIAGNOSIS — F17200 Nicotine dependence, unspecified, uncomplicated: Secondary | ICD-10-CM

## 2023-03-15 ENCOUNTER — Encounter: Payer: Self-pay | Admitting: Family Medicine

## 2023-03-24 ENCOUNTER — Ambulatory Visit
Admission: RE | Admit: 2023-03-24 | Discharge: 2023-03-24 | Disposition: A | Discharge status: Home / Self Care | Payer: Medicare Other | Source: Ambulatory Visit | Attending: Family Medicine | Admitting: Family Medicine

## 2023-03-24 DIAGNOSIS — F17200 Nicotine dependence, unspecified, uncomplicated: Secondary | ICD-10-CM

## 2023-04-29 ENCOUNTER — Other Ambulatory Visit: Payer: Self-pay

## 2023-04-29 DIAGNOSIS — R7611 Nonspecific reaction to tuberculin skin test without active tuberculosis: Secondary | ICD-10-CM | POA: Insufficient documentation

## 2023-04-29 DIAGNOSIS — I2510 Atherosclerotic heart disease of native coronary artery without angina pectoris: Secondary | ICD-10-CM | POA: Insufficient documentation

## 2023-04-29 DIAGNOSIS — R928 Other abnormal and inconclusive findings on diagnostic imaging of breast: Secondary | ICD-10-CM | POA: Insufficient documentation

## 2023-04-29 DIAGNOSIS — R42 Dizziness and giddiness: Secondary | ICD-10-CM | POA: Insufficient documentation

## 2023-05-05 ENCOUNTER — Encounter: Payer: Self-pay | Admitting: Cardiology

## 2023-05-05 ENCOUNTER — Ambulatory Visit: Attending: Cardiology | Admitting: Cardiology

## 2023-05-05 VITALS — BP 126/68 | HR 95 | Ht 64.0 in | Wt 170.4 lb

## 2023-05-05 DIAGNOSIS — E781 Pure hyperglyceridemia: Secondary | ICD-10-CM

## 2023-05-05 DIAGNOSIS — I10 Essential (primary) hypertension: Secondary | ICD-10-CM

## 2023-05-05 DIAGNOSIS — I700 Atherosclerosis of aorta: Secondary | ICD-10-CM | POA: Diagnosis not present

## 2023-05-05 DIAGNOSIS — I2510 Atherosclerotic heart disease of native coronary artery without angina pectoris: Secondary | ICD-10-CM | POA: Diagnosis not present

## 2023-05-05 DIAGNOSIS — R011 Cardiac murmur, unspecified: Secondary | ICD-10-CM | POA: Insufficient documentation

## 2023-05-05 MED ORDER — ROSUVASTATIN CALCIUM 10 MG PO TABS: 10.0000 mg | ORAL_TABLET | Freq: Every day | ORAL | 3 refills | Status: DC

## 2023-05-05 NOTE — Addendum Note (Signed)
 Addended by: Eleonore Chiquito on: 05/05/2023 02:33 PM   Modules accepted: Orders

## 2023-05-05 NOTE — Progress Notes (Signed)
 Cardiology Office Note:    Date:  05/05/2023   ID:  Collene Schlichter, DOB 15-Jul-1955, MRN [id number]  PCP:  Lance Bosch, NP  Cardiologist:  Garwin Brothers, MD   Referring MD: Lance Bosch, NP    ASSESSMENT:    1. Coronary artery calcification   2. Essential hypertension   3. Pure hypertriglyceridemia   4. Aortic atherosclerosis (HCC)   5. Cardiac murmur    PLAN:    In order of problems listed above:  Coronary artery calcification: Patient has excellent effort tolerance.  I discussed my findings with the patient at length and secondary prevention stressed.  Importance of compliance with diet medication stressed and she vocalized understanding.  She was advised to walk at least half an hour a day on a daily basis and she promises to do so. Essential hypertension: Blood pressure stable and diet was emphasized. Mixed dyslipidemia: She will be getting lipid panel today and will start rosuvastatin 10 mg daily.  She will be back in 6 weeks for liver lipid checks. Cardiac murmur: Echocardiogram will be done to assess murmur heard on auscultation. Obesity: Weight reduction stressed diet emphasized and she promises to do better. Patient will be seen in follow-up appointment in 6 months or earlier if the patient has any concerns.    Medication Adjustments/Labs and Tests Ordered: Current medicines are reviewed at length with the patient today.  Concerns regarding medicines are outlined above.  Orders Placed This Encounter  Procedures   EKG 12-Lead   No orders of the defined types were placed in this encounter.    History of Present Illness:    Melissa Bautista is a 68 y.o. female who is being seen today for the evaluation of coronary artery calcification at the request of Lance Bosch, NP.  Patient is a pleasant 68 year old female.  She has past medical history of coronary artery calcification, essential hypertension, mixed dyslipidemia.  She tells me that she walks  on a regular basis at least half an hour a day without any problems.  No chest pain orthopnea or PND.  She is an active lady.  At the time of my evaluation, the patient is alert awake oriented and in no distress.  Past Medical History:  Diagnosis Date   Abnormal mammogram    Benign paroxysmal positional vertigo 02/21/2016   Coronary artery calcification    Essential hypertension 02/21/2016   Frequent headaches 02/21/2016   Hyperlipidemia 03/26/2016   Positive TB test    ? she report slighly less the +, neg cxr   Vertigo     Past Surgical History:  Procedure Laterality Date   BREAST BIOPSY Left    x3-benign per patient   BREAST BIOPSY Right 02/11/2023   MM RT BREAST BX W LOC DEV 1ST LESION IMAGE BX SPEC STEREO GUIDE 02/11/2023 [facility]   CHOLECYSTECTOMY     TUBAL LIGATION      Current Medications: Current Meds  Medication Sig   cholecalciferol (VITAMIN D) 1000 UNITS tablet Take 1,000 Units by mouth daily.   loratadine (CLARITIN) 10 MG tablet Take 10 mg by mouth daily.   SEMAGLUTIDE PO Inject 1 each into the skin once a week.   valsartan-hydrochlorothiazide (DIOVAN-HCT) 80-12.5 MG tablet Take 1 tablet by mouth daily.     Allergies:   Patient has no known allergies.   Social History   Socioeconomic History   Marital status: Divorced    Spouse name: Not on file   Number of children: Not  on file   Years of education: Not on file   Highest education level: Not on file  Occupational History   Not on file  Tobacco Use   Smoking status: Former    Current packs/day: 0.00    Types: Cigarettes    Quit date: 03/01/2013    Years since quitting: 10.1   Smokeless tobacco: Never  Substance and Sexual Activity   Alcohol use: Yes    Comment: 3 glasses of wine/week   Drug use: No   Sexual activity: Not on file  Other Topics Concern   Not on file  Social History Narrative   Work or School: Scientist, 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  ROS:   Please see the history of present illness.    All other systems reviewed and are negative.  EKGs/Labs/Other Studies Reviewed:    The following studies were reviewed today:  EKG Interpretation Date/Time:  Wednesday May 05 2023 13:50:19 EDT Ventricular Rate:  95 PR Interval:  148 QRS Duration:  80 QT Interval:  336 QTC Calculation: 422 R Axis:   43  Text Interpretation: Normal sinus rhythm Normal ECG When compared with ECG of 01-Mar-2014 11:18, PREVIOUS ECG IS PRESENT Confirmed by Belva Crome [phone]) on 05/05/2023 2:11:46 PM     Recent Labs: No results found for requested labs within last 365 days.  Recent Lipid Panel    Component Value Date/Time   CHOL 225 (H) 06/26/2016 0904   TRIG 64 06/26/2016 0904   HDL 83 06/26/2016 0904   CHOLHDL 2.7 06/26/2016 0904   VLDL 13 06/26/2016 0904   LDLCALC 129 (H) 06/26/2016 0904    Physical Exam:    VS:  BP 126/68   Pulse 95   Ht 5\' 4"  (1.626 m)   Wt 170 lb 6.4 oz (77.3 kg)   SpO2 95%   BMI 29.25 kg/m     Wt Readings from Last 3 Encounters:  05/05/23 170 lb 6.4 oz (77.3 kg)  06/26/16 180 lb 9.6 oz (81.9 kg)  03/26/16 183 lb (83 kg)     GEN: Patient is in no acute distress HEENT: Normal NECK: No JVD; No carotid bruits LYMPHATICS: No lymphadenopathy CARDIAC: S1 S2 regular, 2/6 systolic murmur at the apex. RESPIRATORY:  Clear to auscultation without rales, wheezing or rhonchi  ABDOMEN: Soft, non-tender, non-distended MUSCULOSKELETAL:   No edema; No deformity  SKIN: Warm and dry NEUROLOGIC:  Alert and oriented x 3 PSYCHIATRIC:  Normal affect    Signed, Garwin Brothers, MD  05/05/2023 2:22 PM    [facility]

## 2023-05-05 NOTE — Addendum Note (Signed)
 Addended by: Eleonore Chiquito on: 05/05/2023 03:05 PM   Modules accepted: Orders

## 2023-05-05 NOTE — Patient Instructions (Signed)
 Medication Instructions:  Your physician has recommended you make the following change in your medication:   Start Crestor 10 mg daily  *If you need a refill on your cardiac medications before your next appointment, please call your pharmacy*   Lab Work: Your physician recommends that you have a LFT today in the office.  Your physician recommends that you return for lab work in: 6 weeks CMP and lipids. You need to have labs done when you are fasting.  You can come Monday through Friday 8:30 am to 12:00 pm and 1:15 to 4:30. You do not need to make an appointment as the order has already been placed. The labs you are going to have done are BMET, CBC, TSH, LFT and Lipids.  If you have labs (blood work) drawn today and your tests are completely normal, you will receive your results only by: MyChart Message (if you have MyChart) OR A paper copy in the mail If you have any lab test that is abnormal or we need to change your treatment, we will call you to review the results.   Testing/Procedures: Your physician has requested that you have an echocardiogram. Echocardiography is a painless test that uses sound waves to create images of your heart. It provides your doctor with information about the size and shape of your heart and how well your heart's chambers and valves are working. This procedure takes approximately one hour. There are no restrictions for this procedure. Please do NOT wear cologne, perfume, aftershave, or lotions (deodorant is allowed). Please arrive 15 minutes prior to your appointment time.     Follow-Up: At [facility], you and your health needs are our priority.  As part of our continuing mission to provide you with exceptional heart care, we have created designated Provider Care Teams.  These Care Teams include your primary Cardiologist (physician) and Advanced Practice Providers (APPs -  Physician Assistants and Nurse Practitioners) who all work together to provide you  with the care you need, when you need it.  We recommend signing up for the patient portal called "MyChart".  Sign up information is provided on this After Visit Summary.  MyChart is used to connect with patients for Virtual Visits (Telemedicine).  Patients are able to view lab/test results, encounter notes, upcoming appointments, etc.  Non-urgent messages can be sent to your provider as well.   To learn more about what you can do with MyChart, go to [email or web address].    Your next appointment:   9 month(s)  The format for your next appointment:   In Person  Provider:   Belva Crome, MD   Other Instructions Echocardiogram An echocardiogram is a test that uses sound waves (ultrasound) to produce images of the heart. Images from an echocardiogram can provide important information about: Heart size and shape. The size and thickness and movement of your heart's walls. Heart muscle function and strength. Heart valve function or if you have stenosis. Stenosis is when the heart valves are too narrow. If blood is flowing backward through the heart valves (regurgitation). A tumor or infectious growth around the heart valves. Areas of heart muscle that are not working well because of poor blood flow or injury from a heart attack. Aneurysm detection. An aneurysm is a weak or damaged part of an artery wall. The wall bulges out from the normal force of blood pumping through the body. Tell a health care provider about: Any allergies you have. All medicines you are taking, including vitamins, herbs, eye  drops, creams, and over-the-counter medicines. Any blood disorders you have. Any surgeries you have had. Any medical conditions you have. Whether you are pregnant or may be pregnant. What are the risks? Generally, this is a safe test. However, problems may occur, including an allergic reaction to dye (contrast) that may be used during the test. What happens before the test? No specific  preparation is needed. You may eat and drink normally. What happens during the test? You will take off your clothes from the waist up and put on a hospital gown. Electrodes or electrocardiogram (ECG)patches may be placed on your chest. The electrodes or patches are then connected to a device that monitors your heart rate and rhythm. You will lie down on a table for an ultrasound exam. A gel will be applied to your chest to help sound waves pass through your skin. A handheld device, called a transducer, will be pressed against your chest and moved over your heart. The transducer produces sound waves that travel to your heart and bounce back (or "echo" back) to the transducer. These sound waves will be captured in real-time and changed into images of your heart that can be viewed on a video monitor. The images will be recorded on a computer and reviewed by your health care provider. You may be asked to change positions or hold your breath for a short time. This makes it easier to get different views or better views of your heart. In some cases, you may receive contrast through an IV in one of your veins. This can improve the quality of the pictures from your heart. The procedure may vary among health care providers and hospitals.   What can I expect after the test? You may return to your normal, everyday life, including diet, activities, and medicines, unless your health care provider tells you not to do that. Follow these instructions at home: It is up to you to get the results of your test. Ask your health care provider, or the department that is doing the test, when your results will be ready. Keep all follow-up visits. This is important. Summary An echocardiogram is a test that uses sound waves (ultrasound) to produce images of the heart. Images from an echocardiogram can provide important information about the size and shape of your heart, heart muscle function, heart valve function, and other  possible heart problems. You do not need to do anything to prepare before this test. You may eat and drink normally. After the echocardiogram is completed, you may return to your normal, everyday life, unless your health care provider tells you not to do that. This information is not intended to replace advice given to you by your health care provider. Make sure you discuss any questions you have with your health care provider. Document Revised: 09/19/2019 Document Reviewed: 09/19/2019 Elsevier Patient Education  2021 Elsevier Inc.   Important Information About Sugar

## 2023-05-06 ENCOUNTER — Telehealth: Payer: Self-pay

## 2023-05-06 LAB — HEPATIC FUNCTION PANEL
ALT: 14 IU/L (ref 0–32)
AST: 15 IU/L (ref 0–40)
Albumin: 4.7 g/dL (ref 3.9–4.9)
Alkaline Phosphatase: 77 IU/L (ref 44–121)
Bilirubin Total: 0.3 mg/dL (ref 0.0–1.2)
Bilirubin, Direct: 0.12 mg/dL (ref 0.00–0.40)
Total Protein: 6.9 g/dL (ref 6.0–8.5)

## 2023-05-06 NOTE — Telephone Encounter (Signed)
 Left vm to return call.

## 2023-05-06 NOTE — Telephone Encounter (Signed)
-----   Message from Aundra Dubin Revankar sent at 05/06/2023  8:51 AM EDT ----- We have already started her on rosuvastatin.  The results of the study is unremarkable. Please inform patient. I will discuss in detail at next appointment. Cc  primary care/referring physician Garwin Brothers, MD 05/06/2023 8:51 AM

## 2023-05-26 ENCOUNTER — Ambulatory Visit: Attending: Cardiology

## 2023-05-26 DIAGNOSIS — I2510 Atherosclerotic heart disease of native coronary artery without angina pectoris: Secondary | ICD-10-CM

## 2023-05-26 DIAGNOSIS — R011 Cardiac murmur, unspecified: Secondary | ICD-10-CM

## 2023-05-26 LAB — ECHOCARDIOGRAM COMPLETE
Area-P 1/2: 4.21 cm2
S' Lateral: 3 cm

## 2024-01-03 ENCOUNTER — Telehealth: Payer: Self-pay | Admitting: Cardiology

## 2024-01-03 NOTE — Telephone Encounter (Signed)
  Patient is requesting to switch from Dr. Revankar to Dr. Ren Rout. Patient would like to go to [employer] [facility] office

## 2024-01-10 ENCOUNTER — Other Ambulatory Visit: Payer: Self-pay | Admitting: Nurse Practitioner

## 2024-01-10 DIAGNOSIS — Z1231 Encounter for screening mammogram for malignant neoplasm of breast: Secondary | ICD-10-CM

## 2024-02-04 ENCOUNTER — Ambulatory Visit
Admission: RE | Admit: 2024-02-04 | Discharge: 2024-02-04 | Disposition: A | Discharge status: Home / Self Care | Source: Ambulatory Visit | Attending: Nurse Practitioner | Admitting: Nurse Practitioner

## 2024-02-04 DIAGNOSIS — Z1231 Encounter for screening mammogram for malignant neoplasm of breast: Secondary | ICD-10-CM

## 2024-03-07 NOTE — Progress Notes (Signed)
 "     Cardiology Office Note Date:  03/15/2024  ID:  Melissa Bautista, DOB 1955/08/13, MRN [id number] PCP:  Pcp, No  Cardiologist:  Joelle VEAR Ren Donley, MD  Chief Complaint  Patient presents with   Coronary Artery Disease     Problems CAC CT chest- LAD calcification TTE 4/25: 60-65%, G1DD Murmur M: RN10, SE weekly, VN-HTZ 80-12.5  Visits  2/26: CAC, LP, AN20    Discussed the use of AI scribe software for clinical note transcription with the patient, who gave verbal consent to proceed.  History of Present Illness  Melissa Bautista is a 69 year old female who presents for an annual physical exam and to discuss her cholesterol management.  She experiences muscle aches associated with the use of rosuvastatin , which she has been taking intermittently. She stopped the medication temporarily to assess if it was the cause of her muscle aches and noted an improvement in symptoms. She resumed taking it this morning and has two doses left. Her LDL cholesterol was previously noted to be 100 mg/dL.  She has lost some weight recently and works for [employer]. She is unable to perform a lipid test today due to having eaten a piece of toast this morning. Her primary care appointment is scheduled for March 9th, where she plans to have a CBC done due to her blood pressure medication.  She has never been on blood pressure medication until she started working for [employer]. She is planning to retire at the end of June and is interested in monitoring her heart rate when not working. She is currently on a low dose of blood pressure medication.  She exercises regularly, going to the gym four to five times a week, where she walks over a mile and a half on a treadmill with an incline and uses at least six machines for weight training. She was unable to go to the gym recently due to weather conditions but resumed yesterday.  She has a history of smoking but quit at least six years ago. She was  not a heavy smoker, with a pack lasting a week, and smoked more during college. She has not smoked since the COVID-19 pandemic.  No chest pain or shortness of breath. She uses an arm blood pressure cuff at home to monitor her blood pressure.   ROS: Please see the history of present illness. All other systems are reviewed and negative.   PHYSICAL EXAM: VS:  BP 108/62 (BP Location: Right Arm, Patient Position: Sitting, Cuff Size: Normal)   Pulse 86   Ht 5' 4.5 (1.638 m)   Wt 159 lb 6.4 oz (72.3 kg)   SpO2 99%   BMI 26.94 kg/m  , BMI Body mass index is 26.94 kg/m. GEN: Well nourished, well developed, in no acute distress HEENT: normal Neck: no JVD, carotid bruits, or masses Cardiac: RRR; no murmurs, rubs, or gallops,no edema  Respiratory:  CTAB bilaterally, normal work of breathing GI: soft, nontender, nondistended, + BS Extremities: No LE edema Skin: warm and dry, no rash Neuro:  Strength and sensation are intact  Recent Labs: Reviewed  Studies: Reviewed  Assessment & Plan  Coronary artery disease with coronary artery calcification Coronary artery calcification on CT indicates plaque buildup. Calcium  score will guide cholesterol management. - Ordered coronary calcium  score CT scan.  Essential hypertension Blood pressure well-controlled on current medication. Monitoring advised for potential post-retirement adjustment. - Monitor blood pressure at home a couple of mornings a week. -  Continue current antihypertensive medication.  Hyperlipidemia Switching to atorvastatin  20 mg to reduce cholesterol and inflammation. Calcium  score will guide target cholesterol level. - Prescribed atorvastatin  (Lipitor) 20 mg. - Monitor for side effects and adjust medication if necessary. - Consider non-statin options if statins are not tolerated.   Signed, Joelle VEAR Ren Donley, MD  03/15/2024 9:58 AM    [facility] "

## 2024-03-08 ENCOUNTER — Telehealth: Payer: Self-pay | Admitting: *Deleted

## 2024-03-08 NOTE — Telephone Encounter (Signed)
 LVM informing pt that we will not be able to do any labs until on or after your establish care appt.  And that she can call to see if that can be moved up or she could ask her previous provider until she is seen here

## 2024-03-08 NOTE — Telephone Encounter (Signed)
 Copied from CRM [id number]. Topic: Clinical - Request for Lab/Test Order >> Mar 08, 2024  1:59 PM Mesmerise C wrote: Reason for CRM: Patient needs CBC lab to be done for prescription renewal

## 2024-03-15 ENCOUNTER — Ambulatory Visit: Payer: Self-pay

## 2024-03-15 VITALS — BP 108/62 | HR 86 | Ht 64.5 in | Wt 159.4 lb

## 2024-03-15 DIAGNOSIS — I2510 Atherosclerotic heart disease of native coronary artery without angina pectoris: Secondary | ICD-10-CM

## 2024-03-15 DIAGNOSIS — I700 Atherosclerosis of aorta: Secondary | ICD-10-CM

## 2024-03-15 DIAGNOSIS — I10 Essential (primary) hypertension: Secondary | ICD-10-CM

## 2024-03-15 MED ORDER — ATORVASTATIN CALCIUM 20 MG PO TABS: 20.0000 mg | ORAL_TABLET | Freq: Every day | ORAL | 3 refills | Status: AC

## 2024-03-15 NOTE — Patient Instructions (Addendum)
 Medication Instructions:  STOP Crestor  START Lipitor (atorvastatin ) 20mg  Take 1 tablet once a day *If you need a refill on your cardiac medications before your next appointment, please call your pharmacy*  Lab Work: TODAY-LIPID PANEL If you have labs (blood work) drawn today and your tests are completely normal, you will receive your results only by: MyChart Message (if you have MyChart) OR A paper copy in the mail If you have any lab test that is abnormal or we need to change your treatment, we will call you to review the results.  Testing/Procedures: Your provider would like for you to have a Calcium  Score CT. This test is painless. This is a non-contrast CT of the heat to look for calcified lesions in the coronary arteries. The cost of this is test cost $99 out of pocket and is not submitted to your insurance. The test can be performed at our [facility] in [place].  You can get the test scheduled in our office or you can call [phone].  Then press option 4, Then press option 2 Finally press option 2 and get your test scheduled.  Follow-Up: At [facility], you and your health needs are our priority.  As part of our continuing mission to provide you with exceptional heart care, our providers are all part of one team.  This team includes your primary Cardiologist (physician) and Advanced Practice Providers or APPs (Physician Assistants and Nurse Practitioners) who all work together to provide you with the care you need, when you need it.  Your next appointment:   1 year(s)  Provider:   Joelle Ren Ny, MD   We recommend signing up for the patient portal called MyChart.  Sign up information is provided on this After Visit Summary.  MyChart is used to connect with patients for Virtual Visits (Telemedicine).  Patients are able to view lab/test results, encounter notes, upcoming appointments, etc.  Non-urgent messages can be sent to your  provider as well.   To learn more about what you can do with MyChart, go to [email or web address].   Other Instructions KEEP AN EYE ON YOUR BLOOD PRESSURE

## 2024-04-05 ENCOUNTER — Other Ambulatory Visit (HOSPITAL_COMMUNITY)

## 2024-04-17 ENCOUNTER — Ambulatory Visit
